# Patient Record
Sex: Female | Born: 1935 | Race: White | Hispanic: No | Smoking: Former smoker
Health system: Southern US, Community
[De-identification: ages and names within clinical notes are randomized; demographics above are authoritative.]

## PROBLEM LIST (undated history)

## (undated) DIAGNOSIS — I10 Essential (primary) hypertension: Secondary | ICD-10-CM

## (undated) DIAGNOSIS — E119 Type 2 diabetes mellitus without complications: Secondary | ICD-10-CM

## (undated) DIAGNOSIS — E785 Hyperlipidemia, unspecified: Secondary | ICD-10-CM

## (undated) DIAGNOSIS — L409 Psoriasis, unspecified: Secondary | ICD-10-CM

## (undated) HISTORY — PX: APPENDECTOMY: SHX54

## (undated) HISTORY — PX: ABDOMINAL HYSTERECTOMY: SHX81

## (undated) HISTORY — DX: Psoriasis, unspecified: L40.9

## (undated) HISTORY — PX: BLADDER REPAIR: SHX76

## (undated) HISTORY — DX: Essential (primary) hypertension: I10

## (undated) HISTORY — DX: Type 2 diabetes mellitus without complications: E11.9

## (undated) HISTORY — PX: CHOLECYSTECTOMY: SHX55

## (undated) HISTORY — PX: CAROTID STENT INSERTION: SHX5766

## (undated) HISTORY — DX: Hyperlipidemia, unspecified: E78.5

---

## 2005-07-07 ENCOUNTER — Ambulatory Visit: Admission: RE | Admit: 2005-07-07 | Discharge: 2005-07-07 | Payer: Self-pay | Admitting: *Deleted

## 2005-08-15 ENCOUNTER — Ambulatory Visit: Payer: Self-pay | Admitting: Cardiology

## 2005-08-22 ENCOUNTER — Ambulatory Visit: Payer: Self-pay | Admitting: Cardiology

## 2005-09-05 ENCOUNTER — Ambulatory Visit: Payer: Self-pay | Admitting: Cardiology

## 2005-10-09 ENCOUNTER — Inpatient Hospital Stay (HOSPITAL_COMMUNITY): Admission: RE | Admit: 2005-10-09 | Discharge: 2005-10-10 | Payer: Self-pay | Admitting: *Deleted

## 2005-10-09 ENCOUNTER — Encounter (INDEPENDENT_AMBULATORY_CARE_PROVIDER_SITE_OTHER): Payer: Self-pay | Admitting: *Deleted

## 2006-04-26 ENCOUNTER — Ambulatory Visit: Payer: Self-pay | Admitting: *Deleted

## 2006-07-19 ENCOUNTER — Ambulatory Visit: Payer: Self-pay | Admitting: *Deleted

## 2008-12-31 ENCOUNTER — Ambulatory Visit (HOSPITAL_COMMUNITY): Admission: RE | Admit: 2008-12-31 | Discharge: 2008-12-31 | Payer: Self-pay | Admitting: Ophthalmology

## 2009-01-21 ENCOUNTER — Ambulatory Visit (HOSPITAL_COMMUNITY): Admission: RE | Admit: 2009-01-21 | Discharge: 2009-01-21 | Payer: Self-pay | Admitting: Ophthalmology

## 2010-04-25 LAB — GLUCOSE, CAPILLARY: Glucose-Capillary: 112 mg/dL — ABNORMAL HIGH (ref 70–99)

## 2010-04-26 LAB — HEMOGLOBIN AND HEMATOCRIT, BLOOD: HCT: 39.8 % (ref 36.0–46.0)

## 2010-04-26 LAB — BASIC METABOLIC PANEL
CO2: 29 mEq/L (ref 19–32)
Chloride: 101 mEq/L (ref 96–112)
Creatinine, Ser: 0.78 mg/dL (ref 0.4–1.2)
Glucose, Bld: 138 mg/dL — ABNORMAL HIGH (ref 70–99)
Potassium: 3.8 mEq/L (ref 3.5–5.1)

## 2010-04-26 LAB — GLUCOSE, CAPILLARY: Glucose-Capillary: 122 mg/dL — ABNORMAL HIGH (ref 70–99)

## 2010-06-07 NOTE — Assessment & Plan Note (Signed)
OFFICE VISIT   Emily Sanders, Emily Sanders  DOB:  [birth year]                                       07/19/2006  CHART#:[id number]   OFFICE NOTE   The patient returns to the office today with a CT angiogram of the  abdomen with bilateral runoff.  This reveals mild bilateral superficial  femoral artery proximal stenoses.  Moderate stenosis of the left  popliteal artery.  Both anterior tibial arteries are noted to be  occluded.  Intact posterior tibial and peroneal runoff.   The patient has mild claudication symptoms.  No rest pain or night pain.   BP 99/59, pulse 71 per minute.  LOWER EXTREMITIES:  Well-perfused.  Intact popliteal, posterior tibial,  and dorsalis pedis pulses at 1+ bilaterally.   The patient has minimal symptoms and mild peripheral vascular disease.  I have informed her that I do not think she requires any interventions  at this time.  I will plan to follow up with her in 6 months with a  lower extremity Doppler evaluation.   Balinda Quails, M.D.  Electronically Signed   PGH/MEDQ  D:  07/19/2006  T:  07/20/2006  Job:  [id number]   cc:   [place]

## 2010-06-10 NOTE — Assessment & Plan Note (Signed)
[facility]                            [facility] OFFICE NOTE   Emily, Sanders                     MRN:          [id number]  DATE:09/05/2005                            DOB:          Jun 21, 1930    REASON FOR VISIT:  Scheduled followup from recent initial cardiology  evaluation.  Please refer to my office note of July 24 for full details.   At that time, the patient was referred for preoperative cardiac clearance  with no prior cardiac history.  She was found to have asymptomatic, high-  grade left ICA stenosis with residual moderate disease on the right and is  awaiting clearance to proceed with elective carotid endarterectomy.  The  patient has several cardiac risk factors, but no antecedent history of  exertional angina pectoris.  She was referred for several studies.  A 2-D  echocardiogram revealed normal left ventricular function (EF 60-65%).  Adenosine stress Cardiolite revealed question of partial reversibility in  the mid/slash apical inferior wall with calculated ejection fraction of  64'%.  This was reviewed by Dr. Diona Browner who felt that this represented a  low risk study.  The patient also had lower extremity vertebral Dopplers for  evaluation of left leg claudication.  This revealed normal bilateral ABIs  with decreased left toe brachial index (0.5) suggestive of distal disease.   These study results were reviewed in full with the patient.  She continues  to report no exertional symptoms of chest discomfort or dyspnea.   MEDICATIONS:  1. Hyzaar 100/25 mg daily.  2. Aspirin 325 mg daily.  3. Niacin 500 mg daily.   PHYSICAL EXAMINATION:  VITAL SIGNS:  Blood pressure 110/60, pulse 68 and  regular, weight 163.  GENERAL:  Physical examination unchanged from previous visit.   IMPRESSION:  Emily Sanders is a 77 year old female with no prior cardiac  history who is awaiting preoperative clearance to proceed with elective left  carotid endarterectomy, for treatment of an asymptomatic, high-grade, left  (ICA) stenosis.   The patient presents with no past or current history of exertional chest  discomfort or dyspnea on exertion.  She has undergone extensive cardiac work-  up consisting of a 2-D echocardiogram and an adenosine stress Cardiolite,  interpreted as a low risk study.   PLAN:  At this point, no further cardiac work-up is recommended.  The  results of the recent stress test were reviewed in full with Dr. Andee Lineman who  recommended that the patient is allowed to proceed with surgery as planned.  She is considered at a low risk for perioperative cardiac event and will  need to resume aspirin once cleared from a surgical standpoint.   I also instructed the patient to continue aggressive monitoring for risk  factors, notably hypertension and hyperlipidemia.  I also encouraged her to  continue cutting back on tobacco as she has been doing, and to hopefully  discontinue smoking altogether in the hear future.  We will plan on having  her return to the [facility] clinic on an as-needed basis.  Gene Serpe, PA-C                                Learta Codding, MD, [facility]   GS/MedQ  DD:  09/05/2005  DT:  09/05/2005  Job #:  [id number]   cc:   Balinda Quails, MD

## 2010-06-10 NOTE — Assessment & Plan Note (Signed)
[facility]                            [facility] OFFICE NOTE   SANJA, ELIZARDO                     MRN:          [id number]  DATE:08/15/2005                            DOB:          18-Oct-1930    REFERRING PHYSICIAN:  Balinda Quails, M.D., of Cardiovascular and Thoracic  Surgeons in [place].   PRIMARY CARDIOLOGIST:  Jonelle Sidle, M.D., (new).   REASON FOR CONSULTATION:  Ms. Trevino is a very pleasant 77 year old female  with no prior cardiac history, now referred for a preoperative cardiac  clearance.  She is awaiting scheduling for left carotid endarterectomy for  treatment of recent finding of high-grade left ICA stenosis during a routine  screening.   The patient presents with no symptoms suggestive of a TIA, and underwent a  recent carotid Doppler screening, apparently organized by a traveling  service.  A formal followup study, ordered by Dr. Virgina Organ, suggested severe  left ICA stenosis (80 to 99%), as well as moderate disease of the right ICA  (60 to 79%).  There is also suggestion of turbulent left vertebral artery  flow.   From a cardiac perspective, Ms. Daddona presents with no remote or recent  history of exertional chest discomfort.  She also denies any recent decrease  in her exercise tolerance level.   The patient's cardiac risk factors are notable for hypertension,  hyperlipidemia, glucose intolerance, long-standing tobacco smoking, age, as  well as family history.   Electrocardiogram today reveals normal sinus rhythm at 62 BPM with  bifascicular block with RBVD/LAHB, with left axis deviation;  there are no  acute changes.   ALLERGIES:  No known drug allergies.   CURRENT MEDICATIONS:  1.  Hyzaar 100/25 mg daily.  2.  Coated aspirin 325 mg daily.  3.  Niacin 500 mg daily.   PAST MEDICAL HISTORY:  1.  Hypertension.  2.  Hyperlipidemia.  3.  Borderline diabetes, treated with diet.  4.  Long-standing  tobacco.  5.  Status post appendectomy.  6.  Status post cholecystectomy.  7.  Status post hysterectomy.   SOCIAL HISTORY:  The patient is currently widowed (for the second time).  She continues to work full-time for a [employer] fund[employer].  She has six children who  all live up in the [place], [place] area.  She continues to smoke on average  one to two packs a week and has been doing so since her early 24s.  She  denies alcohol use.   FAMILY HISTORY:  Mother deceased at age 58, with long-standing history of  diabetes.  Father deceased at age 74, secondary to myocardial infarction  with prior history of MI.  The patient has one sister, age 45, with a  history of cerebral aneurysm treated surgically.  Of note, however, her  sister's daughter (the patient's niece), died in her early 33s, apparently  from a ruptured cerebral aneurysm.  The patient also reports a maternal  uncle, dying at age 75, also from a probable ruptured cerebral aneurysm.  The patient, however, denies any family history of stroke.   REVIEW OF SYSTEMS:  As noted in HPI.  The patient denies any remote or  recent development of exertional anginal pectoris.  She denies any  exertional dyspnea, orthopnea, or paroxysmal nocturnal dyspnea.  She does,  however, note some recent left lower extremity claudication of the left  thigh and buttock which she states has resolved since starting full dose  aspirin.  She denies any prior history of myocardial infarction, congestive  heart failure, syncope, or stroke.  Denies tachy-palpitations or symptoms  suggestive of reflux disease.  Remaining systems are negative.   PHYSICAL EXAMINATION:  VITAL SIGNS:  Blood pressure 100/70, pulse 60 and  regular, weight 165.  GENERAL:  A 77 year old female, sitting upright, in no apparent distress.  HEENT:  Normocephalic, atraumatic.  NECK:  Palpable carotid pulses with bilateral carotid bruits (left greater  than right).  LUNGS:  Clear to auscultation in  all fields.  HEART:  Regular rate and rhythm (S1 and S2).  A soft grade 1 to 2/6  holosystolic murmur in the upper LSB.  ABDOMEN:  Soft, nontender, intact bowel sounds, no bruits.  EXTREMITIES:  Palpable femoral pulses with a left femoral bruit;  2/4 right  dorsalis and posterior tibialis pulse;  nonpalpable left dorsalis/posterior  tibialis pulse with trace bilateral pedal edema.  NEUROLOGIC:  No focal deficits.   IMPRESSION:  1.  Abnormal resting electrocardiogram.      1.  Bifascicular block.  2.  Severe cerebrovascular disease.      1.  High-grade left internal carotid artery stenosis (80 to 99%).      2.  Residual 60 to 79% right internal carotid artery stenosis.  3.  Multiple cardiac risk factors.      1.  Hypertension.      2.  Hyperlipidemia.      3.  Glucose intolerance.      4.  Long-standing tobacco smoking.      5.  Family history of coronary artery disease.      6.  Age.  4.  Systolic murmur.  5.  Probable peripheral vascular disease.      1.  Left lower extremity claudication/left femoral bruit.   PLAN:  1.  Schedule Adenosine stress Cardiolite for risk stratification and      exclusion of underlying ischemia.  2.  Schedule 2-D echocardiogram for assessment of left ventricular function      and exclusion of significant valvular heart disease.  3.  Schedule lower extremity arterial Doppler's (ankle brachial indexes) for      assessment and exclusion of severe peripheral vascular disease.  4.  Schedule return clinic followup in approximately two weeks for review of      test results and further recommendations.                                   Gene Serpe, PA-C   GS/MedQ  DD:  08/15/2005  DT:  08/15/2005  Job #:  [id number]

## 2010-06-10 NOTE — Op Note (Signed)
NAME:  Emily Sanders, Emily Sanders NO.:  [id number]   MEDICAL RECORD NO.:  [id number]          PATIENT TYPE:  INP   LOCATION:  3313                         FACILITY:  [facility]   PHYSICIAN:  Balinda Quails, M.D.    DATE OF BIRTH:  1930/05/12   DATE OF PROCEDURE:  10/09/2005  DATE OF DISCHARGE:  10/10/2005                                 OPERATIVE REPORT   SURGEON:  Balinda Quails, MD.   ASSISTANT:  J.DHart Rochester, MD.   ANESTHETIC:  General endotracheal.   ANESTHESIOLOGIST:  Guadalupe Maple, M.D.   PREOPERATIVE DIAGNOSIS:  Severe left internal carotid artery stenosis.   POSTOPERATIVE DIAGNOSIS:  Severe left internal carotid artery stenosis.   PROCEDURE:  Left carotid endarterectomy Dacron patch angioplasty.   CLINICAL NOTE:  Emily Sanders is a 77 year old female with known severe  left internal carotid artery stenosis.  She was seen in the office and  evaluated for this.  She is scheduled at this time for left carotid  endarterectomy for reduction of stroke risk.  She has consented to surgery.  The risks and benefits of the operative procedure were explained to the  patient in detail with __________  morbidity mortality of 1-2%.   OPERATIVE PROCEDURE:  The patient was brought to the operating room in  stable hemodynamic condition.  Placed in supine position.  General  endotracheal anesthesia induced.  Foley catheter arterial line in place.  Left neck prepped and draped in a sterile fashion.   A curvilinear skin incision made along the anterior border of the left  sternomastoid muscle.  Dissection carried through the subcutaneous tissue  and platysma with electrocautery.  Deep dissection carried along the  anterior border of the sternomastoid to the carotid sheath.  The common  carotid artery mobilized down to the omohyoid muscle an d encircled with a  Vesseloop.  The superior thyroid and external carotid were encircled with  Vesseloops.  The internal carotid artery  followed distally up to the  posterior belly of the digastric muscle and encircled with a Vesseloop.  The  vagus nerve reflected posteriorly and preserved.  The hypoglossal nerve  clearly identified.   The carotid bifurcation revealed plaque disease extending into the origin of  the left internal carotid artery.  The patient administered 7000 units of  heparin intravenously.  Adequate circulation time permitted.  The carotid  vessels was controlled with clamps.  A  longitudinal arteriotomy made in the  distal common carotid artery.  The arteriotomy extended across the carotid  bulb and up into the internal carotid artery.   There was a high-grade stenosis of the left internal carotid artery at its  origin. A shunt was inserted.   The plaque removed with an endarterectomy elevator.  The endarterectomy  carried down into the common carotid artery with plaque and divided  transversely with Potts scissors.  The plaque then raised up into the vulva,  the superior thyroid and external carotid were endarterectomized using an  eversion technique.  The distal internal carotid artery plaque then  feathered out well.   Fragments of plaque removed with fine  forceps.  The site irrigated with  heparin saline solution.   A patch angioplasty endarterectomy site was then carried out with a running  6-0 Prolene suture using a Finesse Dacron patch.  At completion of the patch  angioplasty, the shunt was removed.  All vessels well flushed.  Clamps  removed directing initial antegrade flow up the external carotid artery,  following this the internal carotid was released.  There was an excellent  pulse and Doppler signal in the distal internal carotid artery.  The patient  administered 50 mg of protamine intravenously.  Adequate hemostasis  obtained.  Sponge and instrument counts correct.   The sternomastoid fascia then closed with running 2-0 Vicryl suture.  Platysma closed with running 3-0 Vicryl  suture.  The skin closed with 4-0  Monocryl.  Steri-Strips applied.  The patient tolerated the procedure well.  No apparent complications.  Transferred to recovery room in stable  condition.      Balinda Quails, M.D.  Electronically Signed     PGH/MEDQ  D:  10/09/2005  T:  10/10/2005  Job:  [id number]   cc:   Jenny Reichmann, MD

## 2010-06-10 NOTE — H&P (Signed)
NAME:  Emily Sanders, Emily Sanders            ACCOUNT NO.:  [id number]   MEDICAL RECORD NO.:  [id number]          PATIENT TYPE:  INP   LOCATION:  NA                           FACILITY:  [facility]   PHYSICIAN:  Balinda Quails, M.D.    DATE OF BIRTH:  February 29, 1930   DATE OF ADMISSION:  DATE OF DISCHARGE:                                HISTORY & PHYSICAL   DATE OF ADMISSION:  October 09, 2005   PRIMARY PHYSICIAN:  Dr. Colon Branch   CHIEF COMPLAINT:  Left internal carotid artery stenosis.   HISTORY OF PRESENT ILLNESS:  Ms. Emily Sanders is a 77 year old Caucasian female  referred to Dr. Madilyn Fireman for extracranial cardiovascular occlusive disease with  bilateral internal carotid artery stenosis.  She recently underwent  noninvasive testing by [facility] which revealed evidence  of peripheral vascular disease as well as carotid artery disease.  She saw  her primary physician who ordered a carotid duplex which was done at  [facility] confirming bilateral internal carotid artery  stenosis, more severe on the left.  Left internal carotid artery velocities  were 271/101 cm consistent with 80-99% stenosis.  The right showed 60-79%  internal carotid artery stenosis.  Left vertebral artery revealed turbulent  flow.  She was asymptomatic of carotid artery disease.  Of note she also had  CT angiography of the neck, also at [facility] confirming carotid  artery stenosis bilaterally.  Due to the degree of the severity of the left,  Dr. Madilyn Fireman recommended that she undergo left carotid endarterectomy to reduce  her risk for future stroke.  After discussing risks and benefits she agreed  to proceed and surgery was initially scheduled for June 19.  However, this  had to be rescheduled due to further workup for cardiac clearance.  She was  seen by Dr. Ival Bible physician assistant, Rozell Searing, at [facility].  EKG showed bifascicular block and further testing was ordered  including adenosine Cardiolite and 2-D echocardiogram.  These revealed an  ejection fraction of 60-65% and the Cardiolite revealed partial  reversibility in the mid/apical inferior wall.  This was reviewed by Dr.  Diona Browner who ultimately felt this was a low-risk study and cleared her from  a cardiac standpoint.  Today, September 13, she is at the [facility] office for a  preoperative history and physical.  She continues to be asymptomatic of her  carotid artery disease; specifically, denies history of stroke or TIA  symptoms, muscle weakness, dysarthria, dysphagia, or visual changes.   PAST MEDICAL HISTORY:  1. Extracranial cerebrovascular occlusive disease with bilateral internal      carotid artery stenosis, left greater than the right.  2. Psoriasis.  3. Hypertension.  4. Hyperlipidemia.  5. Glucose intolerance.  6. Arthritis.  7. Obesity.  8. Tobacco abuse, recently trying to quit.  9. Osteopenia.  10.Probable peripheral vascular disease of the left leg with reported ABI      of 0.5, although the report is not currently available.   PAST SURGICAL HISTORY:  Cholecystectomy, hysterectomy, and appendectomy -  all greater than 35 years ago.   ALLERGIES:  She has no known drug allergies.   MEDICATIONS:  1. Hyzaar 100/25 mg p.o. daily.  2. Aspirin 325 mg p.o. daily.  3. Niacin 250 mg t.i.d. with food.   REVIEW OF SYSTEMS:  See HPI for pertinent positives and negatives.  She  denies shortness of breath, chest pain, or hematochezia.  She does report  that her left leg tires easily and has pain consistent with claudication  involving the left hip, thigh and calf.  She also occasionally has cramps at  rest.  She has no lower extremity edema but says that her calves feel full  at times, particularly on the left.   SOCIAL HISTORY:  She is widowed with six children.  She lives alone.  Her  children live in [place] but her son who is a truck driver will be staying  with her for a few days  postoperatively.  She smoked cigarettes on and off  for several years starting after the age of 27.  She said she smoked up to a-  half to three-quarters pack per day at times and recently has decided to  quit on October 03, 2005.  She does not use alcohol.  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   FAMILY HISTORY:  Mother is deceased at 5 from complications of diabetes.  Her father deceased at 48 from CAD and myocardial infarction.  She has a  brother who has had a history of stroke and sister with diabetes mellitus.   PHYSICAL EXAMINATION:  VITAL SIGNS:  Blood pressure 118/68, heart rate 58,  respirations 20.  GENERAL APPEARANCE:  This is a 77 year old Caucasian female who is alert and  cooperative in no acute distress.  HEENT:  Head is normocephalic, atraumatic.  Pupils equal, round, and  reactive to light and accommodation.  Sclerae are nonicteric.  Her  extraocular movements are intact.  Her oral mucosa is pink and moist, no  lesions were noted.  She does wear complete upper and lower dentures.  NECK:  Her neck is supple, no obvious goiter was noted.  She has palpable  carotid pulses with soft bruits bilaterally.  RESPIRATORY:  Lung sounds are clear, unlabored, and symmetrical on  inspiration.  CARDIAC:  Her heart has a regular rate and rhythm although slightly  bradycardic at 58.  She has a 1/6 systolic ejection murmur auscultated at  the left sternal border.  ABDOMEN:  Abdomen is soft, nontender, nondistended with normoactive bowel  sounds and no hepatosplenomegaly was noted.  She does have a right upper  quadrant scar.  GENITOURINARY/RECTAL:  These exams were deferred.  EXTREMITIES:  Extremities are warm and dry without edema.  She has 2+ radial  pulses bilaterally, 1+ femoral pulse bilaterally, and 1-2+ distal pulses of  her dorsalis pedis and posterior tibial pulses.  Her toenails show evidence  of onychomycosis. NEUROLOGIC:  Neurologic exam is grossly  intact.  She is alert and oriented  x4.  Speech is clear, gait is steady.  Muscle strength is 5/5 in upper and  lower extremities.   ASSESSMENT:  Bilateral internal carotid artery stenosis left greater than  the right.   PLAN:  She will be electively admitted to [facility] to undergo a  left carotid endarterectomy by Dr. Denman George on October 09, 2005.  Postoperatively, Dr. Madilyn Fireman can discuss further monitoring of her right  coronary artery stenosis as well as further evaluation of her peripheral  vascular disease involving the left  leg.  We also discussed continued  smoking cessation at this visit.      Jerold Coombe, P.A.      Balinda Quails, M.D.  Electronically Signed    AWZ/MEDQ  D:  10/05/2005  T:  10/05/2005  Job:  [id number]

## 2010-06-10 NOTE — Discharge Summary (Signed)
NAME:  Emily, Sanders            ACCOUNT NO.:  [id number]   MEDICAL RECORD NO.:  [id number]          PATIENT TYPE:  INP   LOCATION:  3313                         FACILITY:  [facility]   PHYSICIAN:  Balinda Quails, M.D.    DATE OF BIRTH:  [birth year]   DATE OF ADMISSION:  10/09/2005  DATE OF DISCHARGE:  10/10/2005                                 DISCHARGE SUMMARY   DATE OF BIRTH:  August 03, 1930   PHYSICIAN:  Balinda Quails, M.D.   DATE OF ADMISSION:  October 09, 2005   DATE OF DISCHARGE:  October 10, 2005   ADMISSION DIAGNOSIS:  Bilateral internal carotid artery stenosis, left  greater than right.   DISCHARGE/SECONDARY DIAGNOSES:  1. Bilateral internal carotid artery stenosis, left greater than right,      status post left carotid endarterectomy.  2. Psoriasis.  3. Hypertension.  4. Hyperlipidemia.  5. Glucose intolerance.  6. Arthritis.  7. Obesity, mild.  8. Tobacco abuse, recently quit.  9. Osteopenia.  10.Probable peripheral vascular disease of the left leg with reported      ankle brachial index of 0.5, although no report is currently available.  11.History of cholecystectomy, hysterectomy, and appendectomy.  12.NO KNOWN DRUG ALLERGIES.   PROCEDURES:  October 09, 2005, left carotid endarterectomy with Dacron  patch angioplasty by Dr. Balinda Quails.   BRIEF HISTORY:  Emily Sanders is a 77 year old Caucasian female referred to Dr.  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  She recently underwent noninvasive testing by [employer] which revealed carotid artery disease and reportedly  peripheral vascular disease as well.  She saw her primary physician, who  ordered a carotid duplex, which was done at [facility],  confirming bilateral internal carotid artery stenosis, more severe on the  left.  Left internal carotid artery velocities were 271/101, consistent with  80% to 99% stenosis.   There was 60% to 79% stenosis on the right.  Left  vertebral artery revealed turbulent flow.  CT angiography of the neck was  also performed, confirming carotid artery stenosis.  She was asymptomatic.  Based on the severity of her left internal carotid artery stenosis, Dr.  Madilyn Fireman did recommend that she undergo left carotid endarterectomy to reduce  her risk for future stroke.  After discussing risks and benefits, she agreed  to proceed.  However, prior to scheduling, she was sent for cardiac  clearance and saw Dr. Diona Browner, who noted a systolic murmur.  She  subsequently underwent a Cardiolite and 2D echocardiogram, which revealed  ejection fraction of 60% to 65% and partial reversibility in the mid/apical  inferior wall.  Ultimately this was felt to be a low risk study and she was  cleared for surgery.   HOSPITAL COURSE:  Emily Sanders was electively admitted to [facility]  on October 09, 2005, and was taken to the operating room for left carotid  endarterectomy.  There were no intraoperative complications and  postoperatively she was extubated neurologically intact.  After a short stay  in the recovery unit, she was  transferred to the stepdown unit 3300, where  she remained until discharge.  She remained hemodynamically stable postop,  but did require saline fluid bolus for mild hypotension.  By the morning of  postoperative day 1, her blood pressure was overall felt stable in the low  100s/30-40.  She was ambulating without difficulty.  Her heart rhythm was  stable, showing sinus brady/sinus rhythm at the rate of between 50 and high  60s.  She is afebrile and saturating 98% on room air.  She is voiding  following removal of her Foley catheter.  She was tolerating regular diet  without nausea or vomiting or dysphagia.  Postoperative labs were stable  showing a white blood count of 8.4, hemoglobin 11.5, hematocrit 32.8,  platelet count 193, sodium 139, potassium 3.9, BUN 11,  creatinine 0.9, and  blood glucose of 118.  Physical exam showed her heart had a regular rate and  rhythm with a 1-2/6 systolic ejection murmur.  Lung sounds were initially  coarse at the bases, but these cleared with deep breaths.  No wheezes were  auscultated.  Her abdominal exam was benign and neurologically she remained  intact.  Her tongue was midline and she was moving all extremities strong  and symmetrically.  Her incision was clean and dry without evidence of  hematoma.  Later that morning, Emily Sanders met criteria for discharge and was  discharged home on October 10, 2005, in stable condition.   DISCHARGE MEDICATIONS:  1. Tylox 1-2 tablets p.o. q.4 hours p.r.n. pain.  2. Hyzaar 100/25 mg p.o. q. day.  3. Aspirin 325 mg p.o. q. day.  4. Niacin 250 mg p.o. t.i.d. with food.   DISCHARGE INSTRUCTIONS:  She is instructed to avoid driving or heavy lifting  for the next 2-3 weeks.  She was encouraged to continue daily walking and  breathing exercises.  She may shower and clean her incision gently with soap  and water starting October 11, 2005.  She may resume her preoperative  diet.  She is to follow up with Dr. Florina Ou [facility] office on November 02, 2005,  at 1 p.m., and should call sooner if needed or if she develops fever greater  than 101 or redness or drainage from her incision sites.      Jerold Coombe, P.A.      Balinda Quails, M.D.  Electronically Signed    AWZ/MEDQ  D:  10/10/2005  T:  10/10/2005  Job:  [id number]   cc:   Balinda Quails, M.D.  Jenny Reichmann, MD

## 2013-01-09 ENCOUNTER — Ambulatory Visit (INDEPENDENT_AMBULATORY_CARE_PROVIDER_SITE_OTHER): Payer: Medicare Other | Admitting: Podiatry

## 2013-01-09 ENCOUNTER — Encounter: Payer: Self-pay | Admitting: Podiatry

## 2013-01-09 VITALS — BP 122/57 | HR 76 | Resp 18 | Ht 64.0 in | Wt 155.0 lb

## 2013-01-09 DIAGNOSIS — B351 Tinea unguium: Secondary | ICD-10-CM

## 2013-01-09 DIAGNOSIS — M79609 Pain in unspecified limb: Secondary | ICD-10-CM

## 2013-01-09 NOTE — Progress Notes (Signed)
Subjective:     Patient ID: Emily Sanders, female   DOB: Aug 24, 1930, 79 y.o.   MRN: [id number]  HPI patient presents with painful nailbeds that are thick 1-5 both feet and impossible for her to cut   Review of Systems     Objective:   Physical Exam Neurovascular status unchanged with thick painful nail bed 1-5 both feet    Assessment:     Mycotic nail infection with pain 1-5 both fee tot    Plan:     Debridement of painful nail bed 1-5 both feet

## 2013-01-09 NOTE — Progress Notes (Signed)
Pt presents to have B/L 1 - 5 toenails trimmed.

## 2013-04-03 ENCOUNTER — Ambulatory Visit: Payer: Medicare Other | Admitting: Podiatry

## 2013-04-17 ENCOUNTER — Ambulatory Visit: Payer: Medicare Other | Admitting: Podiatry

## 2013-04-17 ENCOUNTER — Encounter: Payer: Self-pay | Admitting: Podiatry

## 2013-04-17 ENCOUNTER — Ambulatory Visit (INDEPENDENT_AMBULATORY_CARE_PROVIDER_SITE_OTHER): Payer: Medicare Other | Admitting: Podiatry

## 2013-04-17 DIAGNOSIS — B351 Tinea unguium: Secondary | ICD-10-CM

## 2013-04-17 DIAGNOSIS — M79609 Pain in unspecified limb: Secondary | ICD-10-CM

## 2013-04-17 NOTE — Patient Instructions (Signed)
Diabetes and Foot Care Diabetes may cause you to have problems because of poor blood supply (circulation) to your feet and legs. This may cause the skin on your feet to become thinner, break easier, and heal more slowly. Your skin may become dry, and the skin may peel and crack. You may also have nerve damage in your legs and feet causing decreased feeling in them. You may not notice minor injuries to your feet that could lead to infections or more serious problems. Taking care of your feet is one of the most important things you can do for yourself.  HOME CARE INSTRUCTIONS  Wear shoes at all times, even in the house. Do not go barefoot. Bare feet are easily injured.  Check your feet daily for blisters, cuts, and redness. If you cannot see the bottom of your feet, use a mirror or ask someone for help.  Wash your feet with warm water (do not use hot water) and mild soap. Then pat your feet and the areas between your toes until they are completely dry. Do not soak your feet as this can dry your skin.  Apply a moisturizing lotion or petroleum jelly (that does not contain alcohol and is unscented) to the skin on your feet and to dry, brittle toenails. Do not apply lotion between your toes.  Trim your toenails straight across. Do not dig under them or around the cuticle. File the edges of your nails with an emery board or nail file.  Do not cut corns or calluses or try to remove them with medicine.  Wear clean socks or stockings every day. Make sure they are not too tight. Do not wear knee-high stockings since they may decrease blood flow to your legs.  Wear shoes that fit properly and have enough cushioning. To break in new shoes, wear them for just a few hours a day. This prevents you from injuring your feet. Always look in your shoes before you put them on to be sure there are no objects inside.  Do not cross your legs. This may decrease the blood flow to your feet.  If you find a minor scrape,  cut, or break in the skin on your feet, keep it and the skin around it clean and dry. These areas may be cleansed with mild soap and water. Do not cleanse the area with peroxide, alcohol, or iodine.  When you remove an adhesive bandage, be sure not to damage the skin around it.  If you have a wound, look at it several times a day to make sure it is healing.  Do not use heating pads or hot water bottles. They may burn your skin. If you have lost feeling in your feet or legs, you may not know it is happening until it is too late.  Make sure your health care provider performs a complete foot exam at least annually or more often if you have foot problems. Report any cuts, sores, or bruises to your health care provider immediately. SEEK MEDICAL CARE IF:   You have an injury that is not healing.  You have cuts or breaks in the skin.  You have an ingrown nail.  You notice redness on your legs or feet.  You feel burning or tingling in your legs or feet.  You have pain or cramps in your legs and feet.  Your legs or feet are numb.  Your feet always feel cold. SEEK IMMEDIATE MEDICAL CARE IF:   There is increasing redness,   swelling, or pain in or around a wound.  There is a red line that goes up your leg.  Pus is coming from a wound.  You develop a fever or as directed by your health care provider.  You notice a bad smell coming from an ulcer or wound. Document Released: 01/07/2000 Document Revised: 09/11/2012 Document Reviewed: 06/18/2012 ExitCare Patient Information 2014 [employer].  

## 2013-04-18 NOTE — Progress Notes (Signed)
Subjective:     Patient ID: Emily Sanders, female   DOB: 1930-12-31, 80 y.o.   MRN: [id number]  HPI patient presents with nail disease 1-5 both feet that are thick and she cannot take care of herself   Review of Systems     Objective:   Physical Exam Neurovascular status intact with thick brittle bed 1-5 both feet that her ingrown in the corners and tender    Assessment:     Chronic mycotic nail infection with pain 1-5 both feet    Plan:     Debridement painful nailbeds 1-5 both feet with no iatrogenic bleeding noted

## 2013-07-21 ENCOUNTER — Ambulatory Visit (INDEPENDENT_AMBULATORY_CARE_PROVIDER_SITE_OTHER): Payer: Medicare Other | Admitting: Podiatry

## 2013-07-21 ENCOUNTER — Encounter: Payer: Self-pay | Admitting: Podiatry

## 2013-07-21 VITALS — BP 103/51 | HR 74 | Resp 18

## 2013-07-21 DIAGNOSIS — M79609 Pain in unspecified limb: Secondary | ICD-10-CM

## 2013-07-21 DIAGNOSIS — M79673 Pain in unspecified foot: Secondary | ICD-10-CM

## 2013-07-21 DIAGNOSIS — B351 Tinea unguium: Secondary | ICD-10-CM

## 2013-07-22 NOTE — Progress Notes (Signed)
Subjective:     Patient ID: Emily Sanders, female   DOB: 03-03-1930, 80 y.o.   MRN: [id number]  HPI patient is found to have thick yellow nailbeds 1-5 both feet that are painful and she cannot cut   Review of Systems     Objective:   Physical Exam Neurovascular status unchanged with thick yellow nailbeds 1-5 both feet that are painful when pressed    Assessment:     Mycotic nail infection is with pain 1-5 both feet    Plan:     Debris painful nailbeds 1-5 both feet with no iatrogenic bleeding noted

## 2013-11-03 ENCOUNTER — Other Ambulatory Visit: Payer: TRICARE For Life (TFL)

## 2015-01-04 ENCOUNTER — Encounter: Payer: Self-pay | Admitting: Podiatry

## 2015-01-04 ENCOUNTER — Ambulatory Visit (INDEPENDENT_AMBULATORY_CARE_PROVIDER_SITE_OTHER): Payer: Medicare Other | Admitting: Podiatry

## 2015-01-04 DIAGNOSIS — L600 Ingrowing nail: Secondary | ICD-10-CM

## 2015-01-04 DIAGNOSIS — L84 Corns and callosities: Secondary | ICD-10-CM

## 2015-01-04 NOTE — Patient Instructions (Signed)

## 2015-01-07 NOTE — Progress Notes (Signed)
Subjective:     Patient ID: Emily Sanders, female   DOB: 29-Aug-1930, 81 y.o.   MRN: [id number]  HPI patient states I am ready to get this nail taken off and I have these chronic lesions on my feet they continue to give me trouble. States she cannot trim the nail it herself and it becomes painful and is becoming thicker   Review of Systems     Objective:   Physical Exam Neurovascular status intact muscle strength adequate with thick deformed big toenail left it's painful when pressed dorsally and is making it hard to wear shoe gear with comfortably. Keratotic lesions plantar aspect fifth metatarsal bilateral    Assessment:     Damaged hallux nail left with chronic deformity along with keratotic lesion formation    Plan:     Reviewed nail removal and discussed risk. Today I infiltrated the left hallux 60 mg like Marcaine mixture did sterile prep to the area and then remove the nail completely exposed the matrix and applied phenol 5 applications 30 seconds followed by alcohol lavage and sterile dressing. Gave instructions on soaks and reappoint and debrided lesions with no iatrogenic bleeding noted

## 2015-01-11 ENCOUNTER — Telehealth: Payer: Self-pay | Admitting: *Deleted

## 2015-01-11 NOTE — Telephone Encounter (Signed)
Called patient at [phone] (Home #) to check to see how they were doing from their ingrown toenail that was performed on Monday, January 04, 2015. I could not leave a message, because the phone just kept ringing and did not roll over to voice mail.

## 2015-02-08 DIAGNOSIS — I10 Essential (primary) hypertension: Secondary | ICD-10-CM | POA: Diagnosis not present

## 2015-02-08 DIAGNOSIS — E119 Type 2 diabetes mellitus without complications: Secondary | ICD-10-CM | POA: Diagnosis not present

## 2015-05-06 DIAGNOSIS — E559 Vitamin D deficiency, unspecified: Secondary | ICD-10-CM | POA: Diagnosis not present

## 2015-05-06 DIAGNOSIS — E7800 Pure hypercholesterolemia, unspecified: Secondary | ICD-10-CM | POA: Diagnosis not present

## 2015-05-06 DIAGNOSIS — E782 Mixed hyperlipidemia: Secondary | ICD-10-CM | POA: Diagnosis not present

## 2015-05-06 DIAGNOSIS — I10 Essential (primary) hypertension: Secondary | ICD-10-CM | POA: Diagnosis not present

## 2015-05-06 DIAGNOSIS — R5381 Other malaise: Secondary | ICD-10-CM | POA: Diagnosis not present

## 2015-05-06 DIAGNOSIS — E569 Vitamin deficiency, unspecified: Secondary | ICD-10-CM | POA: Diagnosis not present

## 2015-05-06 DIAGNOSIS — Z79899 Other long term (current) drug therapy: Secondary | ICD-10-CM | POA: Diagnosis not present

## 2015-05-06 DIAGNOSIS — R531 Weakness: Secondary | ICD-10-CM | POA: Diagnosis not present

## 2015-05-06 DIAGNOSIS — E119 Type 2 diabetes mellitus without complications: Secondary | ICD-10-CM | POA: Diagnosis not present

## 2015-06-02 ENCOUNTER — Encounter: Payer: Self-pay | Admitting: Podiatry

## 2015-06-02 ENCOUNTER — Ambulatory Visit (INDEPENDENT_AMBULATORY_CARE_PROVIDER_SITE_OTHER): Payer: Medicare Other | Admitting: Podiatry

## 2015-06-02 DIAGNOSIS — M79673 Pain in unspecified foot: Secondary | ICD-10-CM | POA: Diagnosis not present

## 2015-06-02 DIAGNOSIS — B351 Tinea unguium: Secondary | ICD-10-CM

## 2015-06-02 DIAGNOSIS — L600 Ingrowing nail: Secondary | ICD-10-CM

## 2015-06-02 NOTE — Patient Instructions (Signed)

## 2015-06-03 NOTE — Progress Notes (Signed)
Subjective:     Patient ID: Emily Sanders, female   DOB: 1930-02-26, 82 y.o.   MRN: [id number]  HPI patient presents with elongated nailbeds 1-5 both feet with yellow dystrophic painful nailbeds 1-5 of both feet   Review of Systems     Objective:   Physical Exam Neurovascular status intact with thick yellow brittle nailbeds 1-5 both feet that are painful when palpated    Assessment:     Mycotic nail infection with pain 1-5 both feet    Plan:     Debris painful nailbeds 1-5 both feet with no iatrogenic bleeding noted

## 2015-08-18 DIAGNOSIS — E119 Type 2 diabetes mellitus without complications: Secondary | ICD-10-CM | POA: Diagnosis not present

## 2015-08-18 DIAGNOSIS — I10 Essential (primary) hypertension: Secondary | ICD-10-CM | POA: Diagnosis not present

## 2015-09-16 DIAGNOSIS — R5381 Other malaise: Secondary | ICD-10-CM | POA: Diagnosis not present

## 2015-09-16 DIAGNOSIS — Z79899 Other long term (current) drug therapy: Secondary | ICD-10-CM | POA: Diagnosis not present

## 2015-09-16 DIAGNOSIS — I10 Essential (primary) hypertension: Secondary | ICD-10-CM | POA: Diagnosis not present

## 2015-09-16 DIAGNOSIS — E119 Type 2 diabetes mellitus without complications: Secondary | ICD-10-CM | POA: Diagnosis not present

## 2015-09-16 DIAGNOSIS — R531 Weakness: Secondary | ICD-10-CM | POA: Diagnosis not present

## 2015-12-14 DIAGNOSIS — I10 Essential (primary) hypertension: Secondary | ICD-10-CM | POA: Diagnosis not present

## 2015-12-14 DIAGNOSIS — E119 Type 2 diabetes mellitus without complications: Secondary | ICD-10-CM | POA: Diagnosis not present

## 2016-03-02 DIAGNOSIS — D225 Melanocytic nevi of trunk: Secondary | ICD-10-CM | POA: Diagnosis not present

## 2016-03-02 DIAGNOSIS — L400 Psoriasis vulgaris: Secondary | ICD-10-CM | POA: Diagnosis not present

## 2016-04-04 ENCOUNTER — Ambulatory Visit (INDEPENDENT_AMBULATORY_CARE_PROVIDER_SITE_OTHER): Payer: Medicare Other | Admitting: Pediatrics

## 2016-04-04 ENCOUNTER — Encounter (INDEPENDENT_AMBULATORY_CARE_PROVIDER_SITE_OTHER): Payer: Self-pay

## 2016-04-04 ENCOUNTER — Encounter: Payer: Self-pay | Admitting: Pediatrics

## 2016-04-04 VITALS — BP 128/65 | HR 69 | Temp 97.0°F | Ht 64.0 in | Wt 163.0 lb

## 2016-04-04 DIAGNOSIS — E559 Vitamin D deficiency, unspecified: Secondary | ICD-10-CM

## 2016-04-04 DIAGNOSIS — E785 Hyperlipidemia, unspecified: Secondary | ICD-10-CM | POA: Diagnosis not present

## 2016-04-04 DIAGNOSIS — E119 Type 2 diabetes mellitus without complications: Secondary | ICD-10-CM | POA: Diagnosis not present

## 2016-04-04 DIAGNOSIS — I10 Essential (primary) hypertension: Secondary | ICD-10-CM

## 2016-04-04 DIAGNOSIS — L409 Psoriasis, unspecified: Secondary | ICD-10-CM

## 2016-04-04 DIAGNOSIS — I152 Hypertension secondary to endocrine disorders: Secondary | ICD-10-CM | POA: Insufficient documentation

## 2016-04-04 DIAGNOSIS — E1159 Type 2 diabetes mellitus with other circulatory complications: Secondary | ICD-10-CM | POA: Insufficient documentation

## 2016-04-04 LAB — BAYER DCA HB A1C WAIVED: HB A1C (BAYER DCA - WAIVED): 7.1 % — ABNORMAL HIGH (ref <7.0)

## 2016-04-04 MED ORDER — ROSUVASTATIN CALCIUM 20 MG PO TABS: 20.0000 mg | ORAL_TABLET | Freq: Every day | ORAL | 1 refills | Status: DC

## 2016-04-04 MED ORDER — LOSARTAN POTASSIUM 50 MG PO TABS: 50.0000 mg | ORAL_TABLET | Freq: Every day | ORAL | 1 refills | Status: DC

## 2016-04-04 MED ORDER — METFORMIN HCL 500 MG PO TABS: 500.0000 mg | ORAL_TABLET | Freq: Two times a day (BID) | ORAL | 1 refills | Status: DC

## 2016-04-04 NOTE — Progress Notes (Signed)
Subjective:   Patient ID: Emily Sanders, female    DOB: Dec 31, 1930, 83 y.o.   MRN: [id number] CC: New Patient (Initial Visit) f/u multiple med problems HPI: Emily Sanders is a 83 y.o. female presenting for New Patient (Initial Visit)  DM2: BGLs at home 100s-120s  controlled with diet, metformin  HTN: no CP, no SOB BPs at home have been 120s/70s Taking med regularly  Had bone density last year she reports was normal  Recent abnormal b/l ABI through routine health screening with insurance/private company On ASA, statin Former smoker  Still works as a Quarry manager with elderly pts  H/o psoriasis, clobetasol helps when has a rash  HLD: on crestor, no s/e No h/o MI, CVA  Past Medical History:  Diagnosis Date  . Diabetes mellitus without complication ([place])   . Hyperlipidemia   . Hypertension   . Psoriasis    Family History  Problem Relation Age of Onset  . Diabetes Mother   . Heart disease Father    Social History   Social History  . Marital status: Widowed    Spouse name: N/A  . Number of children: N/A  . Years of education: N/A   Social History Main Topics  . Smoking status: Former Research scientist (life sciences)  . Smokeless tobacco: Never Used  . Alcohol use No  . Drug use: No  . Sexual activity: Not Asked   Other Topics Concern  . None   Social History Narrative  . None   ROS: All systems negative other than what is in HPI  Objective:    BP 128/65   Pulse 69   Temp 97 F (36.1 C) (Oral)   Ht '5\' 4"'  (1.626 m)   Wt 163 lb (73.9 kg)   BMI 27.98 kg/m   Wt Readings from Last 3 Encounters:  04/04/16 163 lb (73.9 kg)  01/09/13 155 lb (70.3 kg)    Gen: NAD, alert, cooperative with exam, NCAT EYES: EOMI, no conjunctival injection, or no icterus ENT:  OP without erythema LYMPH: no cervical LAD CV: NRRR, normal Y3/K1, systolic ejection murmur II/VI at the RUSB, distal pulses 2+ b/l Resp: moving air well, a few scattered wheezes with inspiration, not with every breath,  normal WOB Abd: +BS, soft, NTND. no guarding or organomegaly Ext: No edema, warm, see foot exam documentation Neuro: Alert and oriented, sensation intact b/l feet  Assessment & Plan:  Emily Sanders was seen today for new patient (initial visit).  Diagnoses and all orders for this visit:  Essential hypertension Well controlled Cont losartan Labs today -     losartan (COZAAR) 50 MG tablet; Take 1 tablet (50 mg total) by mouth daily. -     CMP14+EGFR  Vitamin D deficiency dexa recently normal per pt Cont vitamin D, check level -     VITAMIN D 25 Hydroxy (Vit-D Deficiency, Fractures)  Type 2 diabetes mellitus without complication, without long-term current use of insulin (HCC) a1c 7.1, on metformin alone, avoiding sugar Pt to schedule eye exam See foot exam documentation -     Bayer DCA Hb A1c Waived -     metFORMIN (GLUCOPHAGE) 500 MG tablet; Take 1 tablet (500 mg total) by mouth 2 (two) times daily with a meal. -     Microalbumin / creatinine urine ratio  Psoriasis Stable, Occasional flares, clobetasol helps  Hyperlipidemia, unspecified hyperlipidemia type Stable, cont crestor -     Lipid panel -     rosuvastatin (CRESTOR) 20 MG tablet; Take 1 tablet (20  mg total) by mouth at bedtime.  Follow up plan: Return in about 3 months (around 07/05/2016) for med follow up 3 mo. Assunta Found, MD [place]

## 2016-04-04 NOTE — Patient Instructions (Signed)
Call to schedule eye exam

## 2016-04-05 LAB — CMP14+EGFR
ALBUMIN: 4.4 g/dL (ref 3.5–4.7)
ALK PHOS: 78 IU/L (ref 39–117)
ALT: 20 IU/L (ref 0–32)
AST: 24 IU/L (ref 0–40)
Albumin/Globulin Ratio: 1.8 (ref 1.2–2.2)
BUN / CREAT RATIO: 24 (ref 12–28)
BUN: 20 mg/dL (ref 8–27)
Bilirubin Total: 0.7 mg/dL (ref 0.0–1.2)
CO2: 25 mmol/L (ref 18–29)
CREATININE: 0.84 mg/dL (ref 0.57–1.00)
Calcium: 9 mg/dL (ref 8.7–10.3)
Chloride: 99 mmol/L (ref 96–106)
GFR, EST AFRICAN AMERICAN: 74 mL/min/{1.73_m2} (ref >59)
GFR, EST NON AFRICAN AMERICAN: 64 mL/min/{1.73_m2} (ref >59)
GLOBULIN, TOTAL: 2.4 g/dL (ref 1.5–4.5)
GLUCOSE: 90 mg/dL (ref 65–99)
Potassium: 4.2 mmol/L (ref 3.5–5.2)
SODIUM: 140 mmol/L (ref 134–144)
TOTAL PROTEIN: 6.8 g/dL (ref 6.0–8.5)

## 2016-04-05 LAB — LIPID PANEL
CHOL/HDL RATIO: 4.2 ratio (ref 0.0–4.4)
Cholesterol, Total: 155 mg/dL (ref 100–199)
HDL: 37 mg/dL — AB (ref >39)
LDL CALC: 90 mg/dL (ref 0–99)
Triglycerides: 138 mg/dL (ref 0–149)
VLDL CHOLESTEROL CAL: 28 mg/dL (ref 5–40)

## 2016-04-05 LAB — MICROALBUMIN / CREATININE URINE RATIO
Creatinine, Urine: 111.6 mg/dL
MICROALB/CREAT RATIO: 5 mg/g{creat} (ref 0.0–30.0)
Microalbumin, Urine: 5.6 ug/mL

## 2016-04-05 LAB — VITAMIN D 25 HYDROXY (VIT D DEFICIENCY, FRACTURES): VIT D 25 HYDROXY: 24.4 ng/mL — AB (ref 30.0–100.0)

## 2016-04-19 ENCOUNTER — Other Ambulatory Visit: Payer: Self-pay | Admitting: Pediatrics

## 2016-04-19 DIAGNOSIS — E119 Type 2 diabetes mellitus without complications: Secondary | ICD-10-CM

## 2016-04-19 DIAGNOSIS — I10 Essential (primary) hypertension: Secondary | ICD-10-CM

## 2016-04-20 ENCOUNTER — Telehealth: Payer: Self-pay | Admitting: Pediatrics

## 2016-04-24 MED ORDER — LOSARTAN POTASSIUM 50 MG PO TABS: 50.0000 mg | ORAL_TABLET | Freq: Every day | ORAL | 1 refills | Status: DC

## 2016-04-24 MED ORDER — METFORMIN HCL 500 MG PO TABS: 500.0000 mg | ORAL_TABLET | Freq: Two times a day (BID) | ORAL | 1 refills | Status: DC

## 2016-04-24 NOTE — Telephone Encounter (Signed)
[facility] today, filled. NA and no VM, but done

## 2016-04-24 NOTE — Telephone Encounter (Signed)
duplicate

## 2016-07-05 ENCOUNTER — Encounter: Payer: Self-pay | Admitting: Pediatrics

## 2016-07-05 ENCOUNTER — Ambulatory Visit (INDEPENDENT_AMBULATORY_CARE_PROVIDER_SITE_OTHER): Payer: Medicare Other

## 2016-07-05 ENCOUNTER — Ambulatory Visit (INDEPENDENT_AMBULATORY_CARE_PROVIDER_SITE_OTHER): Payer: Medicare Other | Admitting: Pediatrics

## 2016-07-05 VITALS — BP 125/66 | HR 60 | Temp 96.7°F | Ht 64.0 in | Wt 163.0 lb

## 2016-07-05 DIAGNOSIS — L409 Psoriasis, unspecified: Secondary | ICD-10-CM | POA: Diagnosis not present

## 2016-07-05 DIAGNOSIS — R062 Wheezing: Secondary | ICD-10-CM | POA: Diagnosis not present

## 2016-07-05 DIAGNOSIS — I10 Essential (primary) hypertension: Secondary | ICD-10-CM | POA: Diagnosis not present

## 2016-07-05 DIAGNOSIS — E785 Hyperlipidemia, unspecified: Secondary | ICD-10-CM | POA: Diagnosis not present

## 2016-07-05 DIAGNOSIS — B351 Tinea unguium: Secondary | ICD-10-CM | POA: Diagnosis not present

## 2016-07-05 DIAGNOSIS — E119 Type 2 diabetes mellitus without complications: Secondary | ICD-10-CM

## 2016-07-05 LAB — BAYER DCA HB A1C WAIVED: HB A1C: 6.8 % (ref <7.0)

## 2016-07-05 MED ORDER — ROSUVASTATIN CALCIUM 20 MG PO TABS: 20.0000 mg | ORAL_TABLET | Freq: Every day | ORAL | 1 refills | Status: DC

## 2016-07-05 MED ORDER — LANCETS MISC: 11 refills | Status: DC

## 2016-07-05 MED ORDER — GLUCOSE BLOOD VI STRP: ORAL_STRIP | 12 refills | Status: DC

## 2016-07-05 NOTE — Progress Notes (Signed)
  Subjective:   Patient ID: Emily Sanders, female    DOB: 03-05-1930, 83 y.o.   MRN: [id number] CC: Follow-up (3 month) DM2 HPI: Emily Sanders is a 83 y.o. female presenting for Follow-up (3 month)  DM2: BGLs 110s in the morning Bothered by painful toe nails b/l, has fungal nails, seen by podiatry in the past but has been a while Sometimes has sore areas from the toenails, too thick to trim at home  HTN:  No CP, no SOB  HLD: taking rosuvastatin nightly, no s/e   No SOB, normal appetite No constipation No h/o smoking, no wheezing or breathing problems at home     Psoriasis: much improved with clobetasol, small area remaining on L elbow  Relevant past medical, surgical, family and social history reviewed. Allergies and medications reviewed and updated. History  Smoking Status  . Former Smoker  Smokeless Tobacco  . Never Used   ROS: Per HPI   Objective:    BP 125/66   Pulse 60   Temp (!) 96.7 F (35.9 C) (Oral)   Ht 5\' 4"  (1.626 m)   Wt 163 lb (73.9 kg)   BMI 27.98 kg/m   Wt Readings from Last 3 Encounters:  07/05/16 163 lb (73.9 kg)  04/04/16 163 lb (73.9 kg)  01/09/13 155 lb (70.3 kg)    Gen: NAD, alert, cooperative with exam, NCAT EYES: EOMI, no conjunctival injection, or no icterus CV: NRRR, normal S1/S2, no murmur, distal pulses 2+ b/l Resp: R sided whistling/wheezing with inspiration and expiration, normal breath sounds L side. normal WOB Ext: No edema, warm Neuro: Alert and oriented, strength equal b/l UE and LE, coordination grossly normal MSK: normal muscle bulk Skin: thick toenails throughout, minimal toenail present great toe b/l Second toe L foot with medial callus  Assessment & Plan:  Sahmya was seen today for follow-up multiple med problems  Diagnoses and all orders for this visit:  Type 2 diabetes mellitus without complication, without long-term current use of insulin (HCC) A1c 6.8 cpont current meds, avoid high sugar/carb foods -      Bayer DCA Hb A1c Waived -     Ambulatory referral to Podiatry -     glucose blood (TRUE METRIX BLOOD GLUCOSE TEST) test strip; Use as instructed  Hyperlipidemia, unspecified hyperlipidemia type Stable, cont current med -     rosuvastatin (CRESTOR) 20 MG tablet; Take 1 tablet (20 mg total) by mouth at bedtime.  Onychomycosis -     Ambulatory referral to Podiatry  Psoriasis Stable, cont clobetasol as needed  Essential hypertension Well controlled, cont losartan  Wheezing Asymptomatic, with abnormal exam Will get cxr -     DG Chest 2 View; Future  Follow up plan: Return in about 3 months (around 10/05/2016). Assunta Found, MD [place]

## 2016-07-05 NOTE — Patient Instructions (Signed)
A1c 6.8 today--come back in 3 months

## 2016-07-06 ENCOUNTER — Telehealth: Payer: Self-pay | Admitting: Pediatrics

## 2016-07-06 DIAGNOSIS — E785 Hyperlipidemia, unspecified: Secondary | ICD-10-CM

## 2016-07-06 MED ORDER — ROSUVASTATIN CALCIUM 20 MG PO TABS: 20.0000 mg | ORAL_TABLET | Freq: Every day | ORAL | 1 refills | Status: DC

## 2016-07-06 NOTE — Telephone Encounter (Signed)
[facility] received refills for pts rosuvastatin, lancets & test strips Test Strips for True Metrix need a PA (case ID [id number] with [facility]) they cover Free Style light, & Precision  Left mssg on pts VM to call back

## 2016-07-27 ENCOUNTER — Telehealth: Payer: Self-pay | Admitting: Pediatrics

## 2016-07-27 NOTE — Telephone Encounter (Signed)
Pt will schedule apt

## 2016-10-12 ENCOUNTER — Encounter: Payer: Self-pay | Admitting: Podiatry

## 2016-10-12 ENCOUNTER — Ambulatory Visit (INDEPENDENT_AMBULATORY_CARE_PROVIDER_SITE_OTHER): Payer: Medicare Other | Admitting: Podiatry

## 2016-10-12 DIAGNOSIS — M79674 Pain in right toe(s): Secondary | ICD-10-CM | POA: Diagnosis not present

## 2016-10-12 DIAGNOSIS — M79675 Pain in left toe(s): Secondary | ICD-10-CM

## 2016-10-12 DIAGNOSIS — B351 Tinea unguium: Secondary | ICD-10-CM

## 2016-10-12 DIAGNOSIS — L84 Corns and callosities: Secondary | ICD-10-CM

## 2016-10-12 NOTE — Progress Notes (Signed)
Subjective:    Patient ID: Emily Sanders, female   DOB: 83 y.o.   MRN: [id number]   HPI patient presents with severe nail disease 1-5 both feet and a very painful lesion second digit left with thickness of the nailbeds noted and yellow chronic discoloration of the beds    ROS      Objective:  Physical Exam neurovascular status intact yellow discomfort with pain noted with thick yellow brittle nailbeds 1-5 both feet   Assessment:   Mycotic nail infections with pain 1-5 both feet      Plan:   Debris painful nailbeds 1-5 both feet with no iatrogenic bleeding noted

## 2016-10-20 ENCOUNTER — Encounter: Payer: Self-pay | Admitting: Pediatrics

## 2016-10-20 ENCOUNTER — Ambulatory Visit (INDEPENDENT_AMBULATORY_CARE_PROVIDER_SITE_OTHER): Payer: Medicare Other | Admitting: Pediatrics

## 2016-10-20 VITALS — BP 136/70 | HR 75 | Temp 97.7°F | Ht 64.0 in | Wt 158.6 lb

## 2016-10-20 DIAGNOSIS — I6522 Occlusion and stenosis of left carotid artery: Secondary | ICD-10-CM | POA: Diagnosis not present

## 2016-10-20 DIAGNOSIS — Z23 Encounter for immunization: Secondary | ICD-10-CM | POA: Diagnosis not present

## 2016-10-20 DIAGNOSIS — E119 Type 2 diabetes mellitus without complications: Secondary | ICD-10-CM

## 2016-10-20 DIAGNOSIS — E785 Hyperlipidemia, unspecified: Secondary | ICD-10-CM | POA: Diagnosis not present

## 2016-10-20 DIAGNOSIS — L409 Psoriasis, unspecified: Secondary | ICD-10-CM

## 2016-10-20 DIAGNOSIS — I10 Essential (primary) hypertension: Secondary | ICD-10-CM

## 2016-10-20 MED ORDER — ROSUVASTATIN CALCIUM 20 MG PO TABS: 20.0000 mg | ORAL_TABLET | Freq: Every day | ORAL | 1 refills | Status: DC

## 2016-10-20 MED ORDER — METFORMIN HCL 500 MG PO TABS: 500.0000 mg | ORAL_TABLET | Freq: Two times a day (BID) | ORAL | 1 refills | Status: DC

## 2016-10-20 MED ORDER — CLOBETASOL PROPIONATE 0.05 % EX SOLN: 1.0000 "application " | Freq: Two times a day (BID) | CUTANEOUS | 0 refills | Status: DC

## 2016-10-20 MED ORDER — LANCETS MISC: 11 refills | Status: DC

## 2016-10-20 MED ORDER — GLUCOSE BLOOD VI STRP: ORAL_STRIP | 12 refills | Status: DC

## 2016-10-20 MED ORDER — LOSARTAN POTASSIUM 50 MG PO TABS: 50.0000 mg | ORAL_TABLET | Freq: Every day | ORAL | 1 refills | Status: DC

## 2016-10-20 NOTE — Progress Notes (Signed)
  Subjective:   Patient ID: Emily Sanders, female    DOB: [birth year], 83 y.o.   MRN: [id number] CC: Follow-up (6 month) multiple med problems HPI: Emily Sanders is a 83 y.o. female presenting for Follow-up (6 month)  DM2:  BGLs not taken recently Has continued on metformin  HTN: out of med for two weeks Watching what she is eating  HLD: taking med nightly, doing well  Mood has been ok Pt she stays with has been in hospital recently  No CP, no SOB no fevers   Relevant past medical, surgical, family and social history reviewed. Allergies and medications reviewed and updated. History  Smoking Status  . Former Smoker  Smokeless Tobacco  . Never Used   ROS: Per HPI   Objective:    BP 136/70   Pulse 75   Temp 97.7 F (36.5 C) (Oral)   Ht 5\' 4"  (1.626 m)   Wt 158 lb 9.6 oz (71.9 kg)   BMI 27.22 kg/m   Wt Readings from Last 3 Encounters:  10/20/16 158 lb 9.6 oz (71.9 kg)  07/05/16 163 lb (73.9 kg)  04/04/16 163 lb (73.9 kg)    Gen: NAD, alert, cooperative with exam, NCAT EYES: EOMI, no conjunctival injection, or no icterus ENT: OP without erythema LYMPH: no cervical LAD CV: NRRR, normal V3/K1, II/VI systolic ejection murmur at upper sternal borders, distal pulses 2+ b/l Resp: CTABL, no wheezes, normal WOB Abd: +BS, soft, NTND. no guarding or organomegaly Ext: No edema, warm Neuro: Alert and oriented, sensation intact b/l feet Skin: elbows slightly rough b/l  Assessment & Plan:  Emily Sanders was seen today for follow-up.  Diagnoses and all orders for this visit:  Psoriasis Improving, cont below -     clobetasol (TEMOVATE) 0.05 % external solution; Apply 1 application topically 2 (two) times daily.  Type 2 diabetes mellitus without complication, without long-term current use of insulin (HCC) A1c most recently < 7 Recheck next visit Cont below -     glucose blood (TRUE METRIX BLOOD GLUCOSE TEST) test strip; Use as instructed -     Lancets MISC; Twice  daily -     metFORMIN (GLUCOPHAGE) 500 MG tablet; Take 1 tablet (500 mg total) by mouth 2 (two) times daily with a meal.  Essential hypertension Stable, cont below -     losartan (COZAAR) 50 MG tablet; Take 1 tablet (50 mg total) by mouth daily.  Hyperlipidemia, unspecified hyperlipidemia type Stable, cont below -     rosuvastatin (CRESTOR) 20 MG tablet; Take 1 tablet (20 mg total) by mouth at bedtime.  Stenosis of left carotid artery H/o stent L carotid, pt worried about progression, lost to f/u as surgeon moved -     VAS US CAROTID; Future  Need for immunization against influenza -     Flu Vaccine QUAD 36+ mos IM   Follow up plan: Return in about 3 months (around 01/19/2017). Emily Found, MD [place]

## 2016-10-24 ENCOUNTER — Telehealth: Payer: Self-pay | Admitting: *Deleted

## 2016-10-24 ENCOUNTER — Telehealth: Payer: Self-pay

## 2016-10-24 DIAGNOSIS — E119 Type 2 diabetes mellitus without complications: Secondary | ICD-10-CM

## 2016-10-24 NOTE — Telephone Encounter (Signed)
Pt is scheduled at [facility] on Friday 10/27/16 at 2:30

## 2016-10-24 NOTE — Telephone Encounter (Signed)
Freestyle lite or Precision Xtra are preferred strips for pt's insurance or PA required

## 2016-10-25 MED ORDER — GLUCOSE BLOOD VI STRP: ORAL_STRIP | 12 refills | Status: DC

## 2016-10-25 NOTE — Addendum Note (Signed)
Addended by: Antonietta Barcelona D on: 10/25/2016 08:58 AM   Modules accepted: Orders

## 2016-10-25 NOTE — Telephone Encounter (Signed)
Pt has a True Track meter  True metrix test strips work in it, she will pay for them out of pocket Will send Rx to [facility]

## 2016-10-27 ENCOUNTER — Ambulatory Visit (HOSPITAL_COMMUNITY): Admission: RE | Admit: 2016-10-27 | Payer: Medicare Other | Source: Ambulatory Visit

## 2016-10-31 ENCOUNTER — Telehealth: Payer: Self-pay

## 2016-10-31 NOTE — Telephone Encounter (Signed)
That's fine, can you put in order?

## 2016-10-31 NOTE — Telephone Encounter (Signed)
Patient's been using True Metrix Glucose strips  Insurance prefers [employer] or [employer] xxtra

## 2016-11-01 MED ORDER — GLUCOSE BLOOD VI STRP: ORAL_STRIP | 12 refills | Status: DC

## 2016-11-01 NOTE — Addendum Note (Signed)
Addended by: Antonietta Barcelona D on: 11/01/2016 11:33 AM   Modules accepted: Orders

## 2016-11-01 NOTE — Telephone Encounter (Signed)
Freestyle lite strips sent to pharmacy

## 2016-11-14 ENCOUNTER — Telehealth: Payer: Self-pay | Admitting: Pediatrics

## 2016-11-14 DIAGNOSIS — E119 Type 2 diabetes mellitus without complications: Secondary | ICD-10-CM

## 2016-11-14 MED ORDER — GLUCOSE BLOOD VI STRP: ORAL_STRIP | 12 refills | Status: DC

## 2016-11-14 MED ORDER — BLOOD GLUCOSE MONITOR SYSTEM W/DEVICE KIT: 1.0000 | PACK | Freq: Two times a day (BID) | 0 refills | Status: AC

## 2016-11-14 MED ORDER — LANCETS MISC: 11 refills | Status: DC

## 2016-11-14 NOTE — Telephone Encounter (Signed)
Spoke to pharmacy and they just needed new rx for glucometer, test strips and lancets sent over. Rx sent to pharmacy as requested.

## 2016-11-22 ENCOUNTER — Telehealth: Payer: Self-pay | Admitting: Pediatrics

## 2016-11-22 DIAGNOSIS — E119 Type 2 diabetes mellitus without complications: Secondary | ICD-10-CM

## 2016-11-22 MED ORDER — LANCETS MISC: 11 refills | Status: DC

## 2016-11-22 MED ORDER — GLUCOSE BLOOD VI STRP: ORAL_STRIP | 12 refills | Status: DC

## 2016-11-22 NOTE — Telephone Encounter (Signed)
Brand on test strips and lancets need to be Free style Lite RX changed and sent into the pharmacy

## 2016-12-16 ENCOUNTER — Telehealth: Payer: Self-pay | Admitting: *Deleted

## 2016-12-16 NOTE — Telephone Encounter (Signed)
Patient canceled appt for Dopplers

## 2017-02-15 ENCOUNTER — Other Ambulatory Visit: Payer: Self-pay | Admitting: Pediatrics

## 2017-02-15 DIAGNOSIS — I10 Essential (primary) hypertension: Secondary | ICD-10-CM

## 2017-02-16 NOTE — Telephone Encounter (Signed)
Next OV 02/23/17

## 2017-02-23 ENCOUNTER — Ambulatory Visit (INDEPENDENT_AMBULATORY_CARE_PROVIDER_SITE_OTHER): Payer: Medicare Other | Admitting: Pediatrics

## 2017-02-23 ENCOUNTER — Encounter: Payer: Self-pay | Admitting: Pediatrics

## 2017-02-23 VITALS — BP 136/78 | HR 65 | Temp 97.0°F | Ht 64.0 in | Wt 158.2 lb

## 2017-02-23 DIAGNOSIS — I10 Essential (primary) hypertension: Secondary | ICD-10-CM | POA: Diagnosis not present

## 2017-02-23 DIAGNOSIS — E119 Type 2 diabetes mellitus without complications: Secondary | ICD-10-CM | POA: Diagnosis not present

## 2017-02-23 DIAGNOSIS — R011 Cardiac murmur, unspecified: Secondary | ICD-10-CM | POA: Diagnosis not present

## 2017-02-23 LAB — BAYER DCA HB A1C WAIVED: HB A1C (BAYER DCA - WAIVED): 6.8 % (ref <7.0)

## 2017-02-23 NOTE — Patient Instructions (Signed)
If you do not hear about scheduling for ultrasound of your carotids and heart within the week, call us back.

## 2017-02-23 NOTE — Progress Notes (Signed)
  Subjective:   Patient ID: Emily Sanders, female    DOB: December 22, 1930, 84 y.o.   MRN: [id number] CC: Follow-up (3 month) multiple med problems HPI: Emily Sanders is a 84 y.o. female presenting for Follow-up (3 month)  Carotid artery stenosis: lost to follow up, not yet gotten [place]  HTN: taking losartan regularly No CP, HA, vision changes  DM2: when she checks at home BGLs low 100s in the morning  No lightheadedness, no swelling in LE, no change in exercise tolerance, does not get tired with exercise, feels like she can do whatever she wants to [facility] regularly  Some tenderness L side of neck, no masses Has had some runny nose, scratchy throat past few days No cough, fever. Appetite has been ok Hurts when she turns her head in certain directions  HLD: taking statin regularly, no s/e  Relevant past medical, surgical, family and social history reviewed. Allergies and medications reviewed and updated. Social History   Tobacco Use  Smoking Status Former Smoker  Smokeless Tobacco Never Used   ROS: Per HPI   Objective:    BP 136/78   Pulse 65   Temp (!) 97 F (36.1 C) (Oral)   Ht '5\' 4"'$  (1.626 m)   Wt 158 lb 3.2 oz (71.8 kg)   BMI 27.15 kg/m   Wt Readings from Last 3 Encounters:  02/23/17 158 lb 3.2 oz (71.8 kg)  10/20/16 158 lb 9.6 oz (71.9 kg)  07/05/16 163 lb (73.9 kg)    Gen: NAD, alert, cooperative with exam, NCAT EYES: EOMI, no conjunctival injection, or no icterus ENT:  TMs pearly gray b/l, OP without erythema LYMPH: no cervical LAD Neck: normal thyroid, no redness or masses. Mildly tender  CV: NRRR, normal A7/O1, III/VI systolic murmur upper sternal borders, distal pulses 2+ b/l Resp: CTABL, no wheezes, normal WOB Abd: +BS, soft, NTND. no guarding or organomegaly Ext: No edema, warm Neuro: Alert and oriented, strength equal b/l UE and LE, coordination grossly normal MSK: normal muscle bulk  Assessment & Plan:  Emily Sanders was seen today for follow- med  problems.  Diagnoses and all orders for this visit:  Type 2 diabetes mellitus without complication, without long-term current use of insulin (HCC) On metformin, A1c 6.8 Cont to avoid sugary foods -     Bayer DCA Hb A1c Waived -     Microalbumin / creatinine urine ratio  Heart murmur -     ECHOCARDIOGRAM COMPLETE; Future  Essential hypertension -     BMP8+EGFR -     TSH  L carotid stenosis Doppler ordered last visit, will get scheduled  Follow up plan: Return for 3-6 mo for follow up. Sooner if throat soreness does not improve Emily Found, MD [place]

## 2017-02-24 LAB — BMP8+EGFR
BUN/Creatinine Ratio: 18 (ref 12–28)
BUN: 11 mg/dL (ref 8–27)
CALCIUM: 8.7 mg/dL (ref 8.7–10.3)
CO2: 27 mmol/L (ref 20–29)
CREATININE: 0.61 mg/dL (ref 0.57–1.00)
Chloride: 103 mmol/L (ref 96–106)
GFR calc Af Amer: 96 mL/min/{1.73_m2} (ref >59)
GFR, EST NON AFRICAN AMERICAN: 84 mL/min/{1.73_m2} (ref >59)
Glucose: 125 mg/dL — ABNORMAL HIGH (ref 65–99)
Potassium: 4.1 mmol/L (ref 3.5–5.2)
Sodium: 145 mmol/L — ABNORMAL HIGH (ref 134–144)

## 2017-02-24 LAB — MICROALBUMIN / CREATININE URINE RATIO
Creatinine, Urine: 113.2 mg/dL
Microalb/Creat Ratio: 9.9 mg/g{creat} (ref 0.0–30.0)
Microalbumin, Urine: 11.2 ug/mL

## 2017-02-27 ENCOUNTER — Ambulatory Visit (HOSPITAL_COMMUNITY)
Admission: RE | Admit: 2017-02-27 | Discharge: 2017-02-27 | Disposition: A | Discharge status: Home / Self Care | Payer: Medicare Other | Source: Ambulatory Visit | Attending: Pediatrics | Admitting: Pediatrics

## 2017-02-27 DIAGNOSIS — I6522 Occlusion and stenosis of left carotid artery: Secondary | ICD-10-CM | POA: Diagnosis not present

## 2017-02-27 DIAGNOSIS — I6523 Occlusion and stenosis of bilateral carotid arteries: Secondary | ICD-10-CM | POA: Insufficient documentation

## 2017-02-27 LAB — TSH: TSH: 2.27 u[IU]/mL (ref 0.450–4.500)

## 2017-02-27 LAB — SPECIMEN STATUS REPORT

## 2017-03-05 ENCOUNTER — Ambulatory Visit (HOSPITAL_COMMUNITY)
Admission: RE | Admit: 2017-03-05 | Discharge: 2017-03-05 | Disposition: A | Discharge status: Home / Self Care | Payer: Medicare Other | Source: Ambulatory Visit | Attending: Pediatrics | Admitting: Pediatrics

## 2017-03-05 DIAGNOSIS — I10 Essential (primary) hypertension: Secondary | ICD-10-CM | POA: Insufficient documentation

## 2017-03-05 DIAGNOSIS — E785 Hyperlipidemia, unspecified: Secondary | ICD-10-CM | POA: Diagnosis not present

## 2017-03-05 DIAGNOSIS — E119 Type 2 diabetes mellitus without complications: Secondary | ICD-10-CM | POA: Insufficient documentation

## 2017-03-05 DIAGNOSIS — R011 Cardiac murmur, unspecified: Secondary | ICD-10-CM | POA: Diagnosis not present

## 2017-03-05 DIAGNOSIS — I080 Rheumatic disorders of both mitral and aortic valves: Secondary | ICD-10-CM | POA: Diagnosis not present

## 2017-03-05 LAB — ECHOCARDIOGRAM COMPLETE
AOPV: 0.43 m/s
AV Area VTI index: 0.73 cm2/m2
AV Mean grad: 15 mmHg
AV Peak grad: 26 mmHg
AV VEL mean LVOT/AV: 0.45
AV pk vel: 253 cm/s
AVA: 1.32 cm2
AVAREAMEANV: 1.42 cm2
AVAREAMEANVIN: 0.78 cm2/m2
AVAREAVTI: 1.34 cm2
CHL CUP AV PEAK INDEX: 0.74
CHL CUP AV VEL: 1.32
CHL CUP DOP CALC LVOT VTI: 30.7 cm
CHL CUP MV DEC (S): 285
CHL CUP RV SYS PRESS: 28 mmHg
CHL CUP TV REG PEAK VELOCITY: 249 cm/s
DOP CAL AO MEAN VELOCITY: 178 cm/s
E decel time: 285 msec
E/e' ratio: 11.78
FS: 41 % (ref 28–44)
IV/PV OW: 1.42
LA diam index: 2.15 cm/m2
LA vol A4C: 65.2 ml
LA vol index: 35 mL/m2
LASIZE: 39 mm
LAVOL: 63.6 mL
LDCA: 3.14 cm2
LEFT ATRIUM END SYS DIAM: 39 mm
LV E/e' medial: 11.78
LV TDI E'MEDIAL: 5.55
LV dias vol: 40 mL — AB (ref 46–106)
LV e' LATERAL: 7.07 cm/s
LV sys vol index: 6 mL/m2
LVDIAVOLIN: 22 mL/m2
LVEEAVG: 11.78
LVOT SV: 96 mL
LVOT peak grad rest: 5 mmHg
LVOTD: 20 mm
LVOTPV: 108 cm/s
LVOTVTI: 0.42 cm
LVSYSVOL: 12 mL — AB (ref 14–42)
Lateral S' vel: 9.46 cm/s
MV Peak grad: 3 mmHg
MV pk A vel: 98.7 m/s
MVPKEVEL: 83.3 m/s
PW: 11.4 mm — AB (ref 0.6–1.1)
RV TAPSE: 18.7 mm
Simpson's disk: 70
Stroke v: 28 ml
TDI e' lateral: 7.07
TRMAXVEL: 249 cm/s
VTI: 73.1 cm
Valve area index: 0.73

## 2017-03-05 NOTE — Progress Notes (Signed)
*  PRELIMINARY RESULTS* Echocardiogram 2D Echocardiogram has been performed.  Emily Sanders 03/05/2017, 10:20 AM

## 2017-03-26 ENCOUNTER — Telehealth: Payer: Self-pay | Admitting: Pediatrics

## 2017-03-26 NOTE — Telephone Encounter (Signed)
lmtcb

## 2017-04-05 NOTE — Telephone Encounter (Signed)
Multiple attempts made to contact patient.  This encounter will now be closed  

## 2017-05-10 ENCOUNTER — Ambulatory Visit: Payer: Medicare Other | Admitting: Podiatry

## 2017-05-16 ENCOUNTER — Encounter: Payer: Self-pay | Admitting: Podiatry

## 2017-05-16 ENCOUNTER — Ambulatory Visit (INDEPENDENT_AMBULATORY_CARE_PROVIDER_SITE_OTHER): Payer: Medicare Other | Admitting: Podiatry

## 2017-05-16 DIAGNOSIS — L84 Corns and callosities: Secondary | ICD-10-CM

## 2017-05-16 DIAGNOSIS — E1149 Type 2 diabetes mellitus with other diabetic neurological complication: Secondary | ICD-10-CM | POA: Diagnosis not present

## 2017-05-16 DIAGNOSIS — M79674 Pain in right toe(s): Secondary | ICD-10-CM

## 2017-05-16 DIAGNOSIS — E1140 Type 2 diabetes mellitus with diabetic neuropathy, unspecified: Secondary | ICD-10-CM | POA: Diagnosis not present

## 2017-05-16 DIAGNOSIS — B351 Tinea unguium: Secondary | ICD-10-CM

## 2017-05-16 DIAGNOSIS — M79675 Pain in left toe(s): Secondary | ICD-10-CM | POA: Diagnosis not present

## 2017-05-17 NOTE — Progress Notes (Signed)
Subjective:   Patient ID: Emily Sanders, female   DOB: 84 y.o.   MRN: [id number]   HPI Long-term diabetic presents with nail disease 1-5 both feet and also painful corn callus edge of the second toe right third toe right that are painful when palpated   ROS      Objective:  Physical Exam  Vascular status intact with diminished neurological sharp dull vibratory consistent with probable low-grade neuropathy with mycotic nail infection 1-5 both feet and lesion formation right     Assessment:  Mycotic nail infection with pain 1-5 both feet with lesion formation right with at risk diabetic neuropathic changes     Plan:  Debrided nailbeds 1-5 both feet with neurogenic bleeding and lesions right with sharp steroids mentation with no echogenic bleeding noted

## 2017-05-21 ENCOUNTER — Ambulatory Visit (INDEPENDENT_AMBULATORY_CARE_PROVIDER_SITE_OTHER): Payer: Medicare Other | Admitting: Pediatrics

## 2017-05-21 ENCOUNTER — Encounter: Payer: Self-pay | Admitting: Pediatrics

## 2017-05-21 VITALS — BP 132/73 | HR 76 | Temp 97.5°F | Ht 64.0 in | Wt 162.0 lb

## 2017-05-21 DIAGNOSIS — E785 Hyperlipidemia, unspecified: Secondary | ICD-10-CM | POA: Diagnosis not present

## 2017-05-21 DIAGNOSIS — I10 Essential (primary) hypertension: Secondary | ICD-10-CM | POA: Diagnosis not present

## 2017-05-21 DIAGNOSIS — I6522 Occlusion and stenosis of left carotid artery: Secondary | ICD-10-CM

## 2017-05-21 DIAGNOSIS — I517 Cardiomegaly: Secondary | ICD-10-CM

## 2017-05-21 DIAGNOSIS — E119 Type 2 diabetes mellitus without complications: Secondary | ICD-10-CM | POA: Diagnosis not present

## 2017-05-21 NOTE — Progress Notes (Signed)
  Subjective:   Patient ID: Emily Sanders, female    DOB: 1930/10/14, 84 y.o.   MRN: [id number] CC: Follow-up echocardiogram HPI: Emily Sanders is a 84 y.o. female   Recent echocardiogram ordered for murmur: LVH present.  Mild mitral valve regurgitation.  Mild aortic valve stenosis. LV EF 60-65%.  Hypertension: Says her blood pressure stayed about where it is today.  Taking losartan regularly.  Continues to stay active.  Feels no limitation in activity levels, continues to work, sitting with elderly patients.  Walking regularly.  No shortness of breath or chest pain that limits her activities.  Feeling well overall.  Denies any swelling in her ankles or feet.  No headaches.  Diabetes: Taking metformin regularly.  Avoiding sugary foods.  Hyperlipidemia: Tolerating statin.  Relevant past medical, surgical, family and social history reviewed. Allergies and medications reviewed and updated. Social History   Tobacco Use  Smoking Status Former Smoker  Smokeless Tobacco Never Used   ROS: Per HPI   Objective:    BP 132/73   Pulse 76   Temp (!) 97.5 F (36.4 C) (Oral)   Ht 5\' 4"  (1.626 m)   Wt 162 lb (73.5 kg)   BMI 27.81 kg/m   Wt Readings from Last 3 Encounters:  05/21/17 162 lb (73.5 kg)  02/23/17 158 lb 3.2 oz (71.8 kg)  10/20/16 158 lb 9.6 oz (71.9 kg)    Gen: NAD, alert, cooperative with exam, NCAT EYES: EOMI, no conjunctival injection, or no icterus ENT:  TMs pearly gray b/l, OP without erythema LYMPH: no cervical LAD CV: NRRR, normal S1/S2, III/VI RUSB ejection murmur Resp: no wheezes, normal WOB Abd: +BS, soft, NTND.  Ext: No edema, warm Neuro: Alert and oriented, strength equal b/l UE and LE, coordination grossly normal MSK: normal muscle bulk  EKG: Heart rate 62.  Normal sinus rhythm.  Q waves present in V1-V3.  Slight widening QRS.  Assessment & Plan:  84 year old female here for follow-up multiple medical problems.  Diagnoses and all orders for this  visit:  LVH (left ventricular hypertrophy) Thickened left ventricle on echo.  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  No LVH on EKG.  Patient asymptomatic right now.  Echo obtained for heart murmur, with mild aortic stenosis on echo.  Will watch symptoms for now.  Continue to control blood pressure.  Any new symptoms we will consider further evaluation for amyloidosis. -     EKG 12-Lead  Essential hypertension Controlled, continue current medicines  Type 2 diabetes mellitus without complication, without long-term current use of insulin (HCC) Stable, continue current medicines  Hyperlipidemia, unspecified hyperlipidemia type Stable, continue statin  Follow up plan: Return in about 3 months (around 08/20/2017). Assunta Found, MD [place]

## 2017-06-25 DIAGNOSIS — L603 Nail dystrophy: Secondary | ICD-10-CM | POA: Diagnosis not present

## 2017-06-25 DIAGNOSIS — D225 Melanocytic nevi of trunk: Secondary | ICD-10-CM | POA: Diagnosis not present

## 2017-06-25 DIAGNOSIS — L400 Psoriasis vulgaris: Secondary | ICD-10-CM | POA: Diagnosis not present

## 2017-07-21 ENCOUNTER — Other Ambulatory Visit: Payer: Self-pay | Admitting: Pediatrics

## 2017-07-21 DIAGNOSIS — E785 Hyperlipidemia, unspecified: Secondary | ICD-10-CM

## 2017-07-21 DIAGNOSIS — I10 Essential (primary) hypertension: Secondary | ICD-10-CM

## 2017-07-21 DIAGNOSIS — E119 Type 2 diabetes mellitus without complications: Secondary | ICD-10-CM

## 2017-07-23 NOTE — Telephone Encounter (Signed)
OV 08/20/17

## 2017-08-15 ENCOUNTER — Encounter: Payer: Self-pay | Admitting: Podiatry

## 2017-08-15 ENCOUNTER — Ambulatory Visit (INDEPENDENT_AMBULATORY_CARE_PROVIDER_SITE_OTHER): Payer: Medicare Other | Admitting: Podiatry

## 2017-08-15 DIAGNOSIS — B351 Tinea unguium: Secondary | ICD-10-CM | POA: Diagnosis not present

## 2017-08-15 DIAGNOSIS — M79675 Pain in left toe(s): Secondary | ICD-10-CM | POA: Diagnosis not present

## 2017-08-15 DIAGNOSIS — M79674 Pain in right toe(s): Secondary | ICD-10-CM

## 2017-08-15 NOTE — Progress Notes (Signed)
Subjective:   Patient ID: Emily Sanders, female   DOB: 84 y.o.   MRN: [id number]   HPI Patient presents with thick nail disease 1-5 both feet that are yellow brittle and hard for her to cut   ROS      Objective:  Physical Exam  Mycotic thick yellow brittle nailbeds 1-5 both feet are discolored and crumbly and painful     Assessment:  Mycotic nail infection with pain 1-5 both feet     Plan:  Debrided nailbeds 1-5 both feet with no iatrogenic bleeding noted

## 2017-08-20 ENCOUNTER — Ambulatory Visit (INDEPENDENT_AMBULATORY_CARE_PROVIDER_SITE_OTHER): Payer: Medicare Other

## 2017-08-20 ENCOUNTER — Encounter: Payer: Self-pay | Admitting: Pediatrics

## 2017-08-20 ENCOUNTER — Ambulatory Visit (INDEPENDENT_AMBULATORY_CARE_PROVIDER_SITE_OTHER): Payer: Medicare Other | Admitting: Pediatrics

## 2017-08-20 VITALS — BP 132/66 | HR 63 | Temp 97.1°F | Ht 64.0 in | Wt 158.2 lb

## 2017-08-20 DIAGNOSIS — R0989 Other specified symptoms and signs involving the circulatory and respiratory systems: Secondary | ICD-10-CM

## 2017-08-20 DIAGNOSIS — I350 Nonrheumatic aortic (valve) stenosis: Secondary | ICD-10-CM

## 2017-08-20 DIAGNOSIS — E119 Type 2 diabetes mellitus without complications: Secondary | ICD-10-CM

## 2017-08-20 DIAGNOSIS — E785 Hyperlipidemia, unspecified: Secondary | ICD-10-CM

## 2017-08-20 DIAGNOSIS — J849 Interstitial pulmonary disease, unspecified: Secondary | ICD-10-CM | POA: Diagnosis not present

## 2017-08-20 DIAGNOSIS — I10 Essential (primary) hypertension: Secondary | ICD-10-CM | POA: Diagnosis not present

## 2017-08-20 DIAGNOSIS — I6522 Occlusion and stenosis of left carotid artery: Secondary | ICD-10-CM

## 2017-08-20 LAB — BAYER DCA HB A1C WAIVED: HB A1C (BAYER DCA - WAIVED): 7 % — ABNORMAL HIGH (ref <7.0)

## 2017-08-20 MED ORDER — LOSARTAN POTASSIUM 25 MG PO TABS: 25.0000 mg | ORAL_TABLET | Freq: Every day | ORAL | 3 refills | Status: DC

## 2017-08-20 NOTE — Progress Notes (Signed)
  Subjective:   Patient ID: Emily Sanders, female    DOB: [birth year], 84 y.o.   MRN: [id number] CC: Medical Management of Chronic Issues  HPI: Emily Sanders is a 84 y.o. female   DM2: She has been eating more sugary foods over the last 2 months.  She is pleased with her weight being down.  She is taking her medicine regularly.  Aortic stenosis:   H/o smoking years ago, < 30 pack years  HTN: BPs at home 110s-130s. Did not take med this morning. No lightheadedness, SOB.  She says her blood pressure is always been on the low side.  She takes losartan 50 mg in the morning.  No shortness of breath, exercise intolerance.  She is walking regularly daily.  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  Relevant past medical, surgical, family and social history reviewed. Allergies and medications reviewed and updated. Social History   Tobacco Use  Smoking Status Former Smoker  Smokeless Tobacco Never Used   ROS: Per HPI   Objective:    BP 132/66   Pulse 63   Temp (!) 97.1 F (36.2 C) (Oral)   Ht 5\' 4"  (1.626 m)   Wt 158 lb 3.2 oz (71.8 kg)   BMI 27.15 kg/m   Wt Readings from Last 3 Encounters:  08/20/17 158 lb 3.2 oz (71.8 kg)  05/21/17 162 lb (73.5 kg)  02/23/17 158 lb 3.2 oz (71.8 kg)    Gen: NAD, alert, cooperative with exam, NCAT EYES: EOMI, no conjunctival injection, or no icterus CV: NRRR, III/VI systolic ejection murmur heard throughout, loudest at right upper sternal border.  distal pulses 2+ b/l Resp: Expiratory and expiratory right lower lobe.  Rales present bilateral lower lobes.  normal WOB Abd: +BS, soft, NTND. no guarding or organomegaly Ext: No edema, warm Neuro: Alert and oriented, strength equal b/l UE and LE, coordination grossly normal MSK: normal muscle bulk  Assessment & Plan:  [name] was seen today for medical management of chronic issues.  Diagnoses and all orders for this visit:  Type 2 diabetes mellitus  without complication, without long-term current use of insulin (HCC) A1c of 7.1.  Continue current medicines.  Avoid sugary foods as able. -     Bayer DCA Hb A1c Waived  Essential hypertension Stable, usually 110s to 120s at home. -     losartan (COZAAR) 25 MG tablet; Take 1 tablet (25 mg total) by mouth daily.  Hyperlipidemia, unspecified hyperlipidemia type Stable, continue rosuvastatin.  Bilateral rales -     DG Chest 2 View; Future  Aortic valve stenosis, etiology of cardiac valve disease unspecified -     Ambulatory referral to Cardiology   Follow up plan: Return in about 3 months (around 11/20/2017). Assunta Found, MD [place]

## 2017-09-21 ENCOUNTER — Encounter (INDEPENDENT_AMBULATORY_CARE_PROVIDER_SITE_OTHER): Payer: Self-pay

## 2017-09-21 ENCOUNTER — Ambulatory Visit (INDEPENDENT_AMBULATORY_CARE_PROVIDER_SITE_OTHER): Payer: Medicare Other | Admitting: Cardiovascular Disease

## 2017-09-21 ENCOUNTER — Encounter: Payer: Self-pay | Admitting: Cardiovascular Disease

## 2017-09-21 VITALS — BP 156/79 | HR 62 | Ht 64.0 in | Wt 160.2 lb

## 2017-09-21 DIAGNOSIS — E785 Hyperlipidemia, unspecified: Secondary | ICD-10-CM

## 2017-09-21 DIAGNOSIS — I10 Essential (primary) hypertension: Secondary | ICD-10-CM | POA: Diagnosis not present

## 2017-09-21 DIAGNOSIS — I6523 Occlusion and stenosis of bilateral carotid arteries: Secondary | ICD-10-CM | POA: Diagnosis not present

## 2017-09-21 DIAGNOSIS — I350 Nonrheumatic aortic (valve) stenosis: Secondary | ICD-10-CM

## 2017-09-21 NOTE — Patient Instructions (Signed)

## 2017-09-21 NOTE — Progress Notes (Signed)
CARDIOLOGY CONSULT NOTE  Patient ID: BIBIANA GILLEAN MRN: [id number] DOB/AGE: 82-27-1932 82 y.o.  Admit date: (Not on file) Primary Physician: Eustaquio Maize, MD Referring Physician: Eustaquio Maize, MD  Reason for Consultation: Aortic stenosis  HPI: Emily Sanders is a 84 y.o. female who is being seen today for the evaluation of aortic stenosis at the request of Eustaquio Maize, MD.   Past medical history includes left carotid stent, hypertension, hyperlipidemia, and type 2 diabetes mellitus.  I reviewed a chest x-ray performed on 08/20/2017 which showed persistent diffuse interstitial lung disease and biapical scarring.  I reviewed an echocardiogram dated 03/05/2017 which showed normal left ventricular systolic function and regional wall motion, LVEF 60 to 65%, moderate to severe LVH, grade 1 diastolic dysfunction, moderately thickened aortic valve leaflets with mild stenosis, calculated valve area 1.62 cm, mild to moderate mitral annular calcification with mildly thickened leaflets and mild mitral regurgitation, moderate left atrial dilatation, mild right atrial dilatation, and mild right ventricular enlargement.  I personally reviewed an ECG performed on 05/21/2017 which showed sinus rhythm with right bundle branch block and late R wave transition with nonspecific T wave abnormalities.  Hemoglobin A1c 7% on 08/20/2017.  Renal function was normal on 02/23/2017.  Thyroid function was also normal.  She underwent left internal carotid artery stenting at [facility] in 2007.  Carotid Dopplers on 02/27/2017 showed less than 50% left internal carotid artery stenosis and 50 to 69% right internal carotid artery stenosis.  She very seldom has sharp chest pains over the left breast lasting a second at most.  She denies exertional chest tightness.  She also denies exertional dyspnea as well as palpitations, leg swelling, dizziness, and syncope.  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  She adheres to a very healthy diet and eats oatmeal, also sprouts, smoked herron, soda crackers, bananas, and blueberries.  Social history: She is originally from [place], [place].  She moved to [place] over 20 years ago.  She has 6 adult children.  To live in [place], one in the state of [place], and one in [place].    No Known Allergies  Current Outpatient Medications  Medication Sig Dispense Refill  . aspirin 81 MG tablet Take 81 mg by mouth daily.    . Blood Glucose Monitoring Suppl (BLOOD GLUCOSE MONITOR SYSTEM) w/Device KIT 1 Device by Does not apply route 2 (two) times daily. 1 each 0  . Cholecalciferol (VITAMIN D3 SUPER STRENGTH) 2000 units TABS Take 1 tablet by mouth daily.    . clobetasol (TEMOVATE) 0.05 % external solution Apply 1 application topically 2 (two) times daily. 50 mL 0  . glucose blood test strip Check blood glucose twice daily 100 each 12  . Lancets MISC Twice daily 100 each 11  . losartan (COZAAR) 25 MG tablet Take 1 tablet (25 mg total) by mouth daily. 90 tablet 3  . metFORMIN (GLUCOPHAGE) 500 MG tablet TAKE 1 TABLET BY MOUTH TWICE DAILY WITH  A  MEAL 180 tablet 0  . rosuvastatin (CRESTOR) 20 MG tablet TAKE 1 TABLET BY MOUTH AT BEDTIME 90 tablet 0  . multivitamin-lutein (OCUVITE-LUTEIN) CAPS capsule Take 1 capsule by mouth daily.     No current facility-administered medications for this visit.     Past Medical History:  Diagnosis Date  . Diabetes mellitus without complication ([place])   . Hyperlipidemia   . Hypertension   . Psoriasis     Past  Surgical History:  Procedure Laterality Date  . ABDOMINAL HYSTERECTOMY    . APPENDECTOMY    . BLADDER REPAIR    . CAROTID STENT INSERTION Left   . CHOLECYSTECTOMY      Social History   Socioeconomic History  . Marital status: Widowed    Spouse name: Not on file  . Number of children: Not on file  . Years of education: Not on file  . Highest education level: Not on file   Occupational History  . Not on file  Social Needs  . Financial resource strain: Not on file  . Food insecurity:    Worry: Not on file    Inability: Not on file  . Transportation needs:    Medical: Not on file    Non-medical: Not on file  Tobacco Use  . Smoking status: Former Research scientist (life sciences)  . Smokeless tobacco: Never Used  Substance and Sexual Activity  . Alcohol use: No  . Drug use: No  . Sexual activity: Not on file  Lifestyle  . Physical activity:    Days per week: Not on file    Minutes per session: Not on file  . Stress: Not on file  Relationships  . Social connections:    Talks on phone: Not on file    Gets together: Not on file    Attends religious service: Not on file    Active member of club or organization: Not on file    Attends meetings of clubs or organizations: Not on file    Relationship status: Not on file  . Intimate partner violence:    Fear of current or ex partner: Not on file    Emotionally abused: Not on file    Physically abused: Not on file    Forced sexual activity: Not on file  Other Topics Concern  . Not on file  Social History Narrative  . Not on file     No family history of premature CAD in 1st degree relatives.  Current Meds  Medication Sig  . aspirin 81 MG tablet Take 81 mg by mouth daily.  . Blood Glucose Monitoring Suppl (BLOOD GLUCOSE MONITOR SYSTEM) w/Device KIT 1 Device by Does not apply route 2 (two) times daily.  . Cholecalciferol (VITAMIN D3 SUPER STRENGTH) 2000 units TABS Take 1 tablet by mouth daily.  . clobetasol (TEMOVATE) 0.05 % external solution Apply 1 application topically 2 (two) times daily.  Marland Kitchen glucose blood test strip Check blood glucose twice daily  . Lancets MISC Twice daily  . losartan (COZAAR) 25 MG tablet Take 1 tablet (25 mg total) by mouth daily.  . metFORMIN (GLUCOPHAGE) 500 MG tablet TAKE 1 TABLET BY MOUTH TWICE DAILY WITH  A  MEAL  . rosuvastatin (CRESTOR) 20 MG tablet TAKE 1 TABLET BY MOUTH AT BEDTIME       Review of systems complete and found to be negative unless listed above in HPI    Physical exam Blood pressure (!) 156/79, pulse 62, height _0  (1.626 m), weight 160 lb 3.2 oz (72.7 kg), SpO2 96 %. General: NAD Neck: No JVD, no thyromegaly or thyroid nodule.  Lungs: Clear to auscultation bilaterally with normal respiratory effort. CV: Nondisplaced PMI. Regular rate and rhythm, normal S1/S2, no S3/S4, 2/6 ejection systolic murmur over right upper sternal border.  No peripheral edema.  Soft left carotid bruit and louder right carotid bruit.    Abdomen: Soft, nontender, no distention.  Skin: Intact without lesions or rashes.  Neurologic: Alert and  oriented x 3.  Psych: Normal affect. Extremities: No clubbing or cyanosis.  HEENT: Normal.   ECG: Most recent ECG reviewed.   Labs: Lab Results  Component Value Date/Time   K 4.1 02/23/2017 10:16 AM   BUN 11 02/23/2017 10:16 AM   CREATININE 0.61 02/23/2017 10:16 AM   ALT 20 04/04/2016 03:37 PM   TSH 2.270 02/23/2017 10:16 AM   HGB 13.3 12/29/2008 09:15 AM     Lipids: Lab Results  Component Value Date/Time   LDLCALC 90 04/04/2016 03:37 PM   CHOL 155 04/04/2016 03:37 PM   TRIG 138 04/04/2016 03:37 PM   HDL 37 (L) 04/04/2016 03:37 PM        ASSESSMENT AND PLAN:  1.  Aortic stenosis: Mild in severity.  I will monitor clinically and with routine echocardiographic surveillance.  She is a symptomatically in the standpoint.  2.  Bilateral internal carotid artery stenosis: Dopplers from February 2019 reviewed above.  Left internal carotid artery stent placed in 2007.  Dopplers should be repeated in February 2020.  She is on aspirin and rosuvastatin.  3.  Hypertension: Blood pressure is elevated today but it had been lower in the past.  She was a bit anxious coming to our office.  No changes to therapy at this time.  4.  Hyperlipidemia: LDL 90 on 04/04/2016.  Continue rosuvastatin 20 mg.     Disposition: Follow up in 1  year  Signed: Kate Sable, M.D., F.A.C.C.  09/21/2017, 8:40 AM

## 2017-11-13 ENCOUNTER — Other Ambulatory Visit: Payer: Self-pay | Admitting: Pediatrics

## 2017-11-13 DIAGNOSIS — E119 Type 2 diabetes mellitus without complications: Secondary | ICD-10-CM

## 2017-11-13 DIAGNOSIS — E785 Hyperlipidemia, unspecified: Secondary | ICD-10-CM

## 2017-11-14 ENCOUNTER — Ambulatory Visit (INDEPENDENT_AMBULATORY_CARE_PROVIDER_SITE_OTHER): Payer: Medicare Other | Admitting: Podiatry

## 2017-11-14 DIAGNOSIS — B351 Tinea unguium: Secondary | ICD-10-CM | POA: Diagnosis not present

## 2017-11-14 DIAGNOSIS — M79674 Pain in right toe(s): Secondary | ICD-10-CM

## 2017-11-14 DIAGNOSIS — M79675 Pain in left toe(s): Secondary | ICD-10-CM

## 2017-11-14 NOTE — Telephone Encounter (Signed)
Last lipid 3.13.18

## 2017-11-20 ENCOUNTER — Other Ambulatory Visit: Payer: Self-pay

## 2017-11-20 DIAGNOSIS — E119 Type 2 diabetes mellitus without complications: Secondary | ICD-10-CM

## 2017-11-20 DIAGNOSIS — E785 Hyperlipidemia, unspecified: Secondary | ICD-10-CM

## 2017-11-20 MED ORDER — ROSUVASTATIN CALCIUM 20 MG PO TABS: 20.0000 mg | ORAL_TABLET | Freq: Every day | ORAL | 0 refills | Status: DC

## 2017-11-20 MED ORDER — METFORMIN HCL 500 MG PO TABS: ORAL_TABLET | ORAL | 0 refills | Status: DC

## 2017-11-20 NOTE — Telephone Encounter (Signed)
Last lipid 04/04/16  Dr Evette Doffing

## 2017-11-20 NOTE — Telephone Encounter (Signed)
Last seen 08/20/17  Dr Evette Doffing

## 2017-11-21 ENCOUNTER — Ambulatory Visit (INDEPENDENT_AMBULATORY_CARE_PROVIDER_SITE_OTHER): Payer: Medicare Other

## 2017-11-21 ENCOUNTER — Encounter: Payer: Self-pay | Admitting: Pediatrics

## 2017-11-21 ENCOUNTER — Ambulatory Visit (INDEPENDENT_AMBULATORY_CARE_PROVIDER_SITE_OTHER): Payer: Medicare Other | Admitting: Pediatrics

## 2017-11-21 VITALS — BP 120/64 | HR 75 | Temp 97.3°F | Ht 64.0 in | Wt 158.0 lb

## 2017-11-21 DIAGNOSIS — Z13820 Encounter for screening for osteoporosis: Secondary | ICD-10-CM | POA: Diagnosis not present

## 2017-11-21 DIAGNOSIS — E785 Hyperlipidemia, unspecified: Secondary | ICD-10-CM

## 2017-11-21 DIAGNOSIS — I10 Essential (primary) hypertension: Secondary | ICD-10-CM | POA: Diagnosis not present

## 2017-11-21 DIAGNOSIS — I6522 Occlusion and stenosis of left carotid artery: Secondary | ICD-10-CM | POA: Diagnosis not present

## 2017-11-21 DIAGNOSIS — Z780 Asymptomatic menopausal state: Secondary | ICD-10-CM

## 2017-11-21 DIAGNOSIS — Z23 Encounter for immunization: Secondary | ICD-10-CM

## 2017-11-21 DIAGNOSIS — E119 Type 2 diabetes mellitus without complications: Secondary | ICD-10-CM | POA: Diagnosis not present

## 2017-11-21 LAB — BAYER DCA HB A1C WAIVED: HB A1C: 7.2 % — AB (ref <7.0)

## 2017-11-21 NOTE — Progress Notes (Signed)
  Subjective:   Patient ID: Emily Sanders, female    DOB: [birth year], 84 y.o.   MRN: [id number] CC: Medical Management of Chronic Issues  HPI: Emily Sanders is a 84 y.o. female   DM2: avoiding sugary foods.  Taking metformin regularly.  Seen by podiatry.  They gave her inserts for her slight hammertoes bilateral second toe.  Carotid stenosis: Will be due for repeat Dopplers February 2020  Hyperlipidemia: Tolerating Crestor.  No side effects.  Hypertension: Blood pressures at home usually about the same as here.  No lightheadedness or dizziness.  Postmenopausal: Has had DEXA scan she thinks years ago, does not remember exactly what results were.  Remains fairly active.  No shortness of breath with exertion.  Does not feel limited with activities.  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  Relevant past medical, surgical, family and social history reviewed. Allergies and medications reviewed and updated. Social History   Tobacco Use  Smoking Status Former Smoker  Smokeless Tobacco Never Used   ROS: Per HPI   Objective:    BP 120/64   Pulse 75   Temp (!) 97.3 F (36.3 C) (Oral)   Ht '5\' 4"'$  (1.626 m)   Wt 158 lb (71.7 kg)   BMI 27.12 kg/m   Wt Readings from Last 3 Encounters:  11/21/17 158 lb (71.7 kg)  09/21/17 160 lb 3.2 oz (72.7 kg)  08/20/17 158 lb 3.2 oz (71.8 kg)    Gen: NAD, alert, cooperative with exam, NCAT EYES: EOMI, no conjunctival injection, or no icterus CV: NRRR, normal S1/S2, III/VI RUSB systolic ejection murmur, distal pulses 2+ b/l Resp: slight crackles RLL, unchanged, no wheezes, normal WOB Abd: +BS, soft, NTND. no guarding or organomegaly Ext: No edema, warm Neuro: Alert and oriented, strength equal b/l UE and LE, coordination grossly normal MSK: normal muscle bulk  Assessment &  Plan:  Jazia was seen today for medical management of chronic issues.  Diagnoses and all orders for this visit:  Type 2 diabetes mellitus without complication, without long-term current use of insulin (HCC) -     BMP8+EGFR -     Lipid panel -     Bayer DCA Hb A1c Waived  Essential hypertension -     BMP8+EGFR -     Lipid panel -     Bayer DCA Hb A1c Waived  Hyperlipidemia, unspecified hyperlipidemia type -     BMP8+EGFR -     Lipid panel -     Bayer DCA Hb A1c Waived  Post-menopausal -     DG [facility] DEXA   Follow up plan: Return in about 3 months (around 02/21/2018). Assunta Found, MD [place]

## 2017-11-22 LAB — LIPID PANEL
CHOLESTEROL TOTAL: 95 mg/dL — AB (ref 100–199)
Chol/HDL Ratio: 2.4 ratio (ref 0.0–4.4)
HDL: 40 mg/dL (ref >39)
LDL CALC: 34 mg/dL (ref 0–99)
Triglycerides: 106 mg/dL (ref 0–149)
VLDL CHOLESTEROL CAL: 21 mg/dL (ref 5–40)

## 2017-11-22 LAB — BMP8+EGFR
BUN/Creatinine Ratio: 19 (ref 12–28)
BUN: 15 mg/dL (ref 8–27)
CO2: 28 mmol/L (ref 20–29)
CREATININE: 0.78 mg/dL (ref 0.57–1.00)
Calcium: 9.2 mg/dL (ref 8.7–10.3)
Chloride: 102 mmol/L (ref 96–106)
GFR calc Af Amer: 80 mL/min/{1.73_m2} (ref >59)
GFR calc non Af Amer: 70 mL/min/{1.73_m2} (ref >59)
GLUCOSE: 134 mg/dL — AB (ref 65–99)
Potassium: 4.7 mmol/L (ref 3.5–5.2)
SODIUM: 143 mmol/L (ref 134–144)

## 2017-11-26 ENCOUNTER — Encounter: Payer: Self-pay | Admitting: Podiatry

## 2017-11-26 NOTE — Progress Notes (Signed)
Subjective: "These toenails are hurting me again."  Emily Sanders presents today for follow up of painful mycotic toenails b/l feet which interfere with daily activities and routine tasks.  Pain is aggravated when wearing enclosed shoe gear. Pain is getting progressively worse and relieved with periodic professional debridement.  She does have h/o diabetes for which she takes Metformin.  She is a former smoker.  [name] denies any new pedal problems on today's visit.  Objective: Vascular Examination: Capillary refill time <3 seconds x 10 digits Dorsalis pedis and posterior tibial pulses present b/l No digital hair x 10 digits Skin temperature warm to warm b/l  Dermatological Examination: Skin with normal turgor, texture and tone b/l Toenails 1-5 b/l discolored, thick, dystrophic with subungual debris and pain with palpation to nailbeds due to thickness of nails. No open wounds No interdigital macerations No hyperkeratotic lesions  Musculoskeletal: Muscle strength 5/5 to all LE muscle groups  Neurological: Sensation intact with 10 gram monofilament. Vibratory sensation intact.  Assessment: Painful onychomycosis toenails 1-5 b/l   Plan: 1. Toenails 1-5 b/l were debrided in length and girth without iatrogenic bleeding. 2. Patient to continue soft, supportive shoe gear 3. Patient to report any pedal injuries to medical professional immediately. 4. Follow up 3 months. Patient/POA to call should there be a concern in the interim.

## 2018-02-08 DIAGNOSIS — Z0000 Encounter for general adult medical examination without abnormal findings: Secondary | ICD-10-CM | POA: Diagnosis not present

## 2018-02-14 ENCOUNTER — Ambulatory Visit (INDEPENDENT_AMBULATORY_CARE_PROVIDER_SITE_OTHER): Payer: Medicare Other | Admitting: Podiatry

## 2018-02-14 ENCOUNTER — Encounter: Payer: Self-pay | Admitting: Podiatry

## 2018-02-14 DIAGNOSIS — M79674 Pain in right toe(s): Secondary | ICD-10-CM | POA: Diagnosis not present

## 2018-02-14 DIAGNOSIS — B351 Tinea unguium: Secondary | ICD-10-CM | POA: Diagnosis not present

## 2018-02-14 DIAGNOSIS — M79675 Pain in left toe(s): Secondary | ICD-10-CM | POA: Diagnosis not present

## 2018-02-14 NOTE — Progress Notes (Signed)
Subjective: Emily Sanders presents today with painful, thick toenails 1-5 b/l that she cannot cut and which interfere with daily activities.  Pain is aggravated when wearing enclosed shoe gear.  She is requesting additional Silipos Toe caps.     Current Outpatient Medications:  .  aspirin 81 MG tablet, Take 81 mg by mouth daily., Disp: , Rfl:  .  Blood Glucose Monitoring Suppl (BLOOD GLUCOSE MONITOR SYSTEM) w/Device KIT, 1 Device by Does not apply route 2 (two) times daily., Disp: 1 each, Rfl: 0 .  Cholecalciferol (VITAMIN D3 SUPER STRENGTH) 2000 units TABS, Take 1 tablet by mouth daily., Disp: , Rfl:  .  clobetasol (TEMOVATE) 0.05 % external solution, Apply 1 application topically 2 (two) times daily., Disp: 50 mL, Rfl: 0 .  glucose blood test strip, Check blood glucose twice daily, Disp: 100 each, Rfl: 12 .  Lancets MISC, Twice daily, Disp: 100 each, Rfl: 11 .  losartan (COZAAR) 25 MG tablet, Take 1 tablet (25 mg total) by mouth daily., Disp: 90 tablet, Rfl: 3 .  metFORMIN (GLUCOPHAGE) 500 MG tablet, TAKE 1 TABLET BY MOUTH TWICE DAILY WITH A MEAL, Disp: 180 tablet, Rfl: 0 .  multivitamin-lutein (OCUVITE-LUTEIN) CAPS capsule, Take 1 capsule by mouth daily., Disp: , Rfl:  .  rosuvastatin (CRESTOR) 20 MG tablet, TAKE 1 TABLET BY MOUTH AT BEDTIME, Disp: 90 tablet, Rfl: 0  No Known Allergies  Objective:  Neurovascular status unchanged  Dermatological Examination: Skin with normal turgor, texture and tone b/l  Toenails 1-5 b/l discolored, thick, dystrophic with subungual debris and pain with palpation to nailbeds due to thickness of nails.  Musculoskeletal: Muscle strength 5/5 to all LE muscle groups  No gross bony deformities b/l.  No pain, crepitus or joint limitation noted with ROM.   Assessment: Painful onychomycosis toenails 1-5 b/l   Plan: 1. Toenails 1-5 b/l were debrided in length and girth without iatrogenic bleeding. 2. Dispensed silicone 3. Patient to continue  soft, supportive shoe gear 4. Patient to report any pedal injuries to medical professional immediately. 5. Follow up 3 months. Patient/POA to call should there be a concern in the interim.

## 2018-02-14 NOTE — Patient Instructions (Signed)

## 2018-02-22 ENCOUNTER — Ambulatory Visit (INDEPENDENT_AMBULATORY_CARE_PROVIDER_SITE_OTHER): Payer: Medicare Other | Admitting: Family Medicine

## 2018-02-22 ENCOUNTER — Encounter: Payer: Self-pay | Admitting: Family Medicine

## 2018-02-22 ENCOUNTER — Ambulatory Visit: Payer: Medicare Other | Admitting: Pediatrics

## 2018-02-22 VITALS — BP 138/64 | HR 60 | Temp 97.2°F | Ht 64.0 in | Wt 157.0 lb

## 2018-02-22 DIAGNOSIS — R143 Flatulence: Secondary | ICD-10-CM | POA: Insufficient documentation

## 2018-02-22 DIAGNOSIS — E785 Hyperlipidemia, unspecified: Secondary | ICD-10-CM | POA: Diagnosis not present

## 2018-02-22 DIAGNOSIS — E1159 Type 2 diabetes mellitus with other circulatory complications: Secondary | ICD-10-CM

## 2018-02-22 DIAGNOSIS — Z23 Encounter for immunization: Secondary | ICD-10-CM | POA: Diagnosis not present

## 2018-02-22 DIAGNOSIS — I350 Nonrheumatic aortic (valve) stenosis: Secondary | ICD-10-CM | POA: Diagnosis not present

## 2018-02-22 DIAGNOSIS — I10 Essential (primary) hypertension: Secondary | ICD-10-CM

## 2018-02-22 DIAGNOSIS — E1169 Type 2 diabetes mellitus with other specified complication: Secondary | ICD-10-CM

## 2018-02-22 DIAGNOSIS — E119 Type 2 diabetes mellitus without complications: Secondary | ICD-10-CM

## 2018-02-22 MED ORDER — LOSARTAN POTASSIUM 25 MG PO TABS: 25.0000 mg | ORAL_TABLET | Freq: Every day | ORAL | 1 refills | Status: DC

## 2018-02-22 MED ORDER — ROSUVASTATIN CALCIUM 20 MG PO TABS: 20.0000 mg | ORAL_TABLET | Freq: Every day | ORAL | 1 refills | Status: DC

## 2018-02-22 MED ORDER — METFORMIN HCL 500 MG PO TABS: ORAL_TABLET | ORAL | 1 refills | Status: DC

## 2018-02-22 NOTE — Progress Notes (Signed)
Subjective: CC: DM PCP: Janora Norlander, DO TXM:IWOEHOZYY E Emily Sanders is a 85 y.o. female presenting to clinic today for:  1. Type 2 Diabetes with hypertension and hyperlipidemia:  Diagnosed about 13 years ago.  Patient reports that she measures blood sugars regularly and they are never greater than 100.  She does report some increased sugar intake over the holidays.  She wonders if this affected her blood sugars.  She has not had her diabetic eye exam yet but is planning on scheduling this soon.  She sees my eye doctor in [place].  She reports compliance with metformin, Crestor and losartan.  She needs refills on all 3 of these.  She had fasting labs recently which indicated controlled cholesterol.  Last eye exam: due Last foot exam: 11/21/2017 Last A1c:  Lab Results  Component Value Date   HGBA1C 7.2 (H) 11/21/2017   Nephropathy screen indicated?: on ARB Last flu, zoster and/or pneumovax:  Due for PNA Immunization History  Administered Date(s) Administered  . Influenza, High Dose Seasonal PF 11/21/2017  . Influenza,inj,Quad PF,6+ Mos 10/20/2016    ROS: Denies  dizziness, LOC, polyuria, polydipsia, unintended weight loss/gain, foot ulcerations, numbness or tingling in extremities or chest pain.  2. Heart murmur Patient reports that she was seen by cardiology last year for heart murmur.  She still unsure as to what her diagnosis is.  She does not recall ever having an echocardiogram but would like to further evaluate this if possible.  Denies any shortness of breath, lower extremity edema or orthopnea.  No change in exercise or activity tolerance.  She currently works 4 hours/day as a Quarry manager.  3.  Gas Patient reports quite a bit of bloating, particularly related to consumption of beans and various vegetables.  She is wondering if there is anything that she can do to help improve the symptoms as she very much enjoys eating vegetables.  ROS: Per HPI  No Known Allergies Past Medical  History:  Diagnosis Date  . Diabetes mellitus without complication ([place])   . Hyperlipidemia   . Hypertension   . Psoriasis     Current Outpatient Medications:  .  aspirin 81 MG tablet, Take 81 mg by mouth daily., Disp: , Rfl:  .  Blood Glucose Monitoring Suppl (BLOOD GLUCOSE MONITOR SYSTEM) w/Device KIT, 1 Device by Does not apply route 2 (two) times daily., Disp: 1 each, Rfl: 0 .  Cholecalciferol (VITAMIN D3 SUPER STRENGTH) 2000 units TABS, Take 1 tablet by mouth daily., Disp: , Rfl:  .  clobetasol (TEMOVATE) 0.05 % external solution, Apply 1 application topically 2 (two) times daily., Disp: 50 mL, Rfl: 0 .  glucose blood test strip, Check blood glucose twice daily, Disp: 100 each, Rfl: 12 .  Lancets MISC, Twice daily, Disp: 100 each, Rfl: 11 .  losartan (COZAAR) 25 MG tablet, Take 1 tablet (25 mg total) by mouth daily., Disp: 90 tablet, Rfl: 3 .  metFORMIN (GLUCOPHAGE) 500 MG tablet, TAKE 1 TABLET BY MOUTH TWICE DAILY WITH A MEAL, Disp: 180 tablet, Rfl: 0 .  multivitamin-lutein (OCUVITE-LUTEIN) CAPS capsule, Take 1 capsule by mouth daily., Disp: , Rfl:  .  rosuvastatin (CRESTOR) 20 MG tablet, TAKE 1 TABLET BY MOUTH AT BEDTIME, Disp: 90 tablet, Rfl: 0 Social History   Socioeconomic History  . Marital status: Widowed    Spouse name: Not on file  . Number of children: Not on file  . Years of education: Not on file  . Highest education level: Not on  file  Occupational History  . Not on file  Social Needs  . Financial resource strain: Not on file  . Food insecurity:    Worry: Not on file    Inability: Not on file  . Transportation needs:    Medical: Not on file    Non-medical: Not on file  Tobacco Use  . Smoking status: Former Research scientist (life sciences)  . Smokeless tobacco: Never Used  Substance and Sexual Activity  . Alcohol use: No  . Drug use: No  . Sexual activity: Not on file  Lifestyle  . Physical activity:    Days per week: Not on file    Minutes per session: Not on file  . Stress:  Not on file  Relationships  . Social connections:    Talks on phone: Not on file    Gets together: Not on file    Attends religious service: Not on file    Active member of club or organization: Not on file    Attends meetings of clubs or organizations: Not on file    Relationship status: Not on file  . Intimate partner violence:    Fear of current or ex partner: Not on file    Emotionally abused: Not on file    Physically abused: Not on file    Forced sexual activity: Not on file  Other Topics Concern  . Not on file  Social History Narrative  . Not on file   Family History  Problem Relation Age of Onset  . Diabetes Mother   . Heart disease Father     Objective: Office vital signs reviewed. BP 138/64   Pulse 60   Temp (!) 97.2 F (36.2 C) (Oral)   Ht '5\' 4"'  (1.626 m)   Wt 157 lb (71.2 kg)   BMI 26.95 kg/m   Physical Examination:  General: Awake, alert, well nourished, No acute distress HEENT: Normal, sclera white, MMM Cardio: regular rate and rhythm, S1S2 heard, 3/6 SEM appreciated at bilateral sternal borders. Pulm: clear to auscultation bilaterally, no wheezes, rhonchi or rales; normal work of breathing on room air Extremities: warm, well perfused, No edema, cyanosis or clubbing; +2 pulses bilaterally MSK: normal gait and station  Assessment/ Plan: 85 y.o. female   1. Type 2 diabetes mellitus without complication, without long-term current use of insulin (HCC) Under control for age and other comorbidities.  She would like tighter control and will modify diet accordingly. - Bayer DCA Hb A1c Waived - metFORMIN (GLUCOPHAGE) 500 MG tablet; TAKE 1 TABLET BY MOUTH TWICE DAILY WITH A MEAL  Dispense: 180 tablet; Refill: 1  2. Hyperlipidemia associated with type 2 diabetes mellitus ([place]) May consider decreasing given LDL in 30s. - rosuvastatin (CRESTOR) 20 MG tablet; Take 1 tablet (20 mg total) by mouth at bedtime.  Dispense: 90 tablet; Refill: 1  3. Hypertension  associated with diabetes ([place]) Controlled with Cozaar 25 mg upon manual recheck.  4. Nonrheumatic aortic (valve) stenosis Asymptomatic.  I reviewed her echocardiogram with her from last year.  I also reviewed Dr Marthann Schiller note.  5. Excessive gas Recommend probiotic.   Orders Placed This Encounter  Procedures  . Bayer DCA Hb A1c Waived   Meds ordered this encounter  Medications  . metFORMIN (GLUCOPHAGE) 500 MG tablet    Sig: TAKE 1 TABLET BY MOUTH TWICE DAILY WITH A MEAL    Dispense:  180 tablet    Refill:  1  . rosuvastatin (CRESTOR) 20 MG tablet    Sig: Take 1  tablet (20 mg total) by mouth at bedtime.    Dispense:  90 tablet    Refill:  1  . losartan (COZAAR) 25 MG tablet    Sig: Take 1 tablet (25 mg total) by mouth daily.    Dispense:  90 tablet    Refill:  [place], [place] [phone]

## 2018-02-22 NOTE — Patient Instructions (Addendum)
Continue your medications.  Your sugar was under good control today. We can see eachother again this summer for recheck, sooner if you need me.  Have the eye doctor in [place] send me a copy of your results.   Aortic Valve Stenosis  Aortic valve stenosis is a narrowing of the aortic valve in the heart. The aortic valve opens and closes to regulate blood flow between the left side of the heart (left ventricle) and the artery that leads away from the heart (aorta). When the aortic valve becomes narrow, it is difficult for the heart to pump blood out to the body, which causes the heart to work harder. The extra work can weaken the heart muscle over time. Aortic valve stenosis can range from mild to severe. If it is not treated, it can become more severe over time and lead to heart failure. What are the causes? This condition may be caused by:  Buildup of calcium around and on the aortic valve. This can occur with aging. This is the most common cause of aortic valve stenosis.  A heart problem that developed in the womb (birth defect).  Rheumatic fever.  Radiation to the chest. What increases the risk? You may be more likely to develop this condition if:  You are older than age 67.  You were born with an abnormal bicuspid valve. What are the signs or symptoms? You may not have any symptoms until your condition becomes severe. It may take 10-20 years for mild or moderate aortic valve stenosis to become severe. Symptoms may include:  Shortness of breath. This may get worse during physical activity.  Feeling unusually weak and tired (fatigue).  Extreme discomfort in the chest, neck, or arm during physical activity (angina).  A heartbeat that is irregular or faster than normal (palpitations).  Dizziness or fainting. This may happen when you get physically tired or after you take certain heart medicines, such as nitroglycerin. How is this diagnosed? This condition may be diagnosed  with:  A physical exam.  Echocardiogram. This is a type of imaging test that uses sound waves (ultrasound) to make images of your heart. There are two kinds of this test that may be used. ? Transthoracic echocardiogram (TTE). For this type, a wand-like tool (transducer) is moved over your chest to create ultrasound images that are recorded by a computer. ? Transesophageal echocardiogram (TEE). For this type, a flexible tube (probe) is inserted down the part of the body that moves food from your mouth to your stomach (esophagus). The heart and the esophagus are close to each other. Your health care provider will use the probe to take clear, detailed pictures of the heart.  Cardiac catheterization. For this procedure, a small, thin tube (catheter) is passed through a large vein in your neck, groin, or arm. The catheter is used to get information about arteries, structures, blood pressure, and oxygen levels in your heart.  Stress tests. These are tests that evaluate the blood supply to your heart and your heart's response to exercise. You may work with a health care provider who specializes in the heart (cardiologist) for diagnosis and treatment. How is this treated? Treatment depends on how severe your condition is and what your symptoms are. You will need to have your heart checked regularly to make sure that your condition is not getting worse or causing serious problems. Treatment may also include:  Surgery to replace your aortic valve. This is the most common treatment for aortic valve stenosis, and  it is the only treatment to cure the condition. Several types of surgeries are available. The surgery may be done: ? Through a large incision over your heart (open-heart surgery). ? Through small incisions, using a flexible tube called a catheter (transcatheter aortic valve replacement, TAVR).  Medicines that help to keep your heart rate regular.  Medicines that thin your blood (anticoagulants) to  prevent blood clots.  Antibiotic medicines to help prevent infection. If your condition is mild, you may only need regular follow-up visits for monitoring. Follow these instructions at home: Lifestyle  Limit alcohol intake to no more than 1 drink a day for nonpregnant women and 2 drinks a day for men. One drink equals 12 oz of beer, 5 oz of wine, or 1 oz of hard liquor.  Do not use any products that contain nicotine or tobacco, such as cigarettes and e-cigarettes. If you need help quitting, ask your health care provider.  Work with your health care provider to manage your blood pressure and cholesterol.  Maintain a healthy weight. Eating and drinking   Eat a heart-healthy diet that includes plenty of fresh fruits and vegetables, whole grains, lean protein, and low-fat or nonfat dairy.  Limit how much caffeine you drink. Caffeine can affect your heart's rate and rhythm.  Avoid foods that are: ? High in salt (sodium), saturated fat, or sugar. ? Canned or highly processed. ? Fried.  Follow instructions from your health care provider about any other eating or drinking restrictions. Activity  Exercise regularly and return to your normal activities as told by your health care provider. Ask your health care provider what amount and type of physical activity is safe for you. ? If your aortic valve stenosis is mild, you may only need to avoid very intense physical activity, such as heavy weight lifting. ? The more severe your aortic valve stenosis is, the more activities you may need to avoid. If you are taking blood thinners:  Before you take any medicines that contain aspirin or NSAIDs, talk with your health care provider. These medicines increase your risk for dangerous bleeding.  Take your medicine exactly as told, at the same time every day.  Avoid activities that could cause injury or bruising, and follow instructions about how to prevent falls.  Wear a medical alert bracelet or  carry a card that lists what medicines you take. General instructions  Take over-the-counter and prescription medicines only as told by your health care provider.  If you were prescribed an antibiotic, take it as told by your health care provider. Do not stop taking the antibiotic even if you start to feel better.  If you are a woman and you plan to become pregnant, talk with your health care provider before you become pregnant.  Before you have any type of medical or dental procedure or surgery, tell all health care providers that you have aortic valve stenosis. This may affect the treatment that you receive.  Keep all follow-up visits as told by your health care provider. This is important. Contact a health care provider if:  You have a fever. Get help right away if:  You develop any of the following symptoms: ? Chest pain. ? Chest tightness. ? Shortness of breath. ? Trouble breathing.  You feel light-headed.  You feel like you might faint.  Your heartbeat is irregular or faster than normal. These symptoms may represent a serious problem that is an emergency. Do not wait to see if the symptoms will go away.  Get medical help right away. Call your local emergency services (911 in the [place] Do not drive yourself to the hospital. Summary  Aortic valve stenosis is a narrowing of the aortic valve in the heart. The aortic valve opens and closes to regulate blood flow between the left side of the heart (left ventricle) and the artery that leads away from the heart (aorta).  Aortic valve stenosis can range from mild to severe. If it is not treated, it can become more severe over time and lead to heart failure.  Treatment depends on how severe your condition is and what your symptoms are. You will need to have your heart checked regularly to make sure that your condition is not getting worse or causing serious problems.  Exercise regularly and return to your normal activities as told by  your health care provider. Ask your health care provider what amount and type of physical activity is safe for you. This information is not intended to replace advice given to you by your health care provider. Make sure you discuss any questions you have with your health care provider. Document Released: 10/08/2002 Document Revised: 10/12/2016 Document Reviewed: 10/12/2016 Elsevier Interactive Patient Education  2019 [employer].

## 2018-02-26 LAB — BAYER DCA HB A1C WAIVED: HB A1C: 7.3 % — AB (ref <7.0)

## 2018-04-16 ENCOUNTER — Encounter: Payer: Self-pay | Admitting: *Deleted

## 2018-05-16 ENCOUNTER — Ambulatory Visit: Payer: Medicare Other | Admitting: Podiatry

## 2018-05-29 ENCOUNTER — Ambulatory Visit: Payer: Medicare Other | Admitting: Podiatry

## 2018-06-04 DIAGNOSIS — M79671 Pain in right foot: Secondary | ICD-10-CM | POA: Diagnosis not present

## 2018-06-04 DIAGNOSIS — I739 Peripheral vascular disease, unspecified: Secondary | ICD-10-CM | POA: Diagnosis not present

## 2018-06-04 DIAGNOSIS — E1140 Type 2 diabetes mellitus with diabetic neuropathy, unspecified: Secondary | ICD-10-CM | POA: Diagnosis not present

## 2018-06-04 DIAGNOSIS — M79672 Pain in left foot: Secondary | ICD-10-CM | POA: Diagnosis not present

## 2018-06-04 DIAGNOSIS — L110 Acquired keratosis follicularis: Secondary | ICD-10-CM | POA: Diagnosis not present

## 2018-06-26 ENCOUNTER — Telehealth: Payer: Self-pay | Admitting: Family Medicine

## 2018-08-20 DIAGNOSIS — L110 Acquired keratosis follicularis: Secondary | ICD-10-CM | POA: Diagnosis not present

## 2018-08-20 DIAGNOSIS — M79671 Pain in right foot: Secondary | ICD-10-CM | POA: Diagnosis not present

## 2018-08-20 DIAGNOSIS — E1140 Type 2 diabetes mellitus with diabetic neuropathy, unspecified: Secondary | ICD-10-CM | POA: Diagnosis not present

## 2018-08-20 DIAGNOSIS — M79672 Pain in left foot: Secondary | ICD-10-CM | POA: Diagnosis not present

## 2018-08-20 DIAGNOSIS — I739 Peripheral vascular disease, unspecified: Secondary | ICD-10-CM | POA: Diagnosis not present

## 2018-08-23 ENCOUNTER — Ambulatory Visit: Payer: Medicare Other | Admitting: Family Medicine

## 2018-08-26 ENCOUNTER — Ambulatory Visit: Payer: Medicare Other | Admitting: Family Medicine

## 2018-09-13 ENCOUNTER — Ambulatory Visit: Payer: Medicare Other | Admitting: Family Medicine

## 2018-09-13 ENCOUNTER — Other Ambulatory Visit: Payer: Self-pay

## 2018-09-16 ENCOUNTER — Ambulatory Visit (INDEPENDENT_AMBULATORY_CARE_PROVIDER_SITE_OTHER): Payer: Medicare Other | Admitting: Family Medicine

## 2018-09-16 ENCOUNTER — Other Ambulatory Visit: Payer: Self-pay

## 2018-09-16 ENCOUNTER — Encounter: Payer: Self-pay | Admitting: Family Medicine

## 2018-09-16 VITALS — BP 133/69 | HR 65 | Temp 98.6°F | Ht 63.0 in | Wt 147.0 lb

## 2018-09-16 DIAGNOSIS — I6521 Occlusion and stenosis of right carotid artery: Secondary | ICD-10-CM | POA: Diagnosis not present

## 2018-09-16 DIAGNOSIS — E1169 Type 2 diabetes mellitus with other specified complication: Secondary | ICD-10-CM

## 2018-09-16 DIAGNOSIS — E785 Hyperlipidemia, unspecified: Secondary | ICD-10-CM

## 2018-09-16 DIAGNOSIS — R221 Localized swelling, mass and lump, neck: Secondary | ICD-10-CM

## 2018-09-16 DIAGNOSIS — I350 Nonrheumatic aortic (valve) stenosis: Secondary | ICD-10-CM | POA: Diagnosis not present

## 2018-09-16 DIAGNOSIS — I10 Essential (primary) hypertension: Secondary | ICD-10-CM | POA: Diagnosis not present

## 2018-09-16 DIAGNOSIS — E119 Type 2 diabetes mellitus without complications: Secondary | ICD-10-CM

## 2018-09-16 DIAGNOSIS — R82998 Other abnormal findings in urine: Secondary | ICD-10-CM

## 2018-09-16 DIAGNOSIS — E1159 Type 2 diabetes mellitus with other circulatory complications: Secondary | ICD-10-CM

## 2018-09-16 LAB — BAYER DCA HB A1C WAIVED: HB A1C (BAYER DCA - WAIVED): 7.1 % — ABNORMAL HIGH (ref <7.0)

## 2018-09-16 MED ORDER — LOSARTAN POTASSIUM 25 MG PO TABS: 25.0000 mg | ORAL_TABLET | Freq: Every day | ORAL | 1 refills | Status: DC

## 2018-09-16 MED ORDER — METFORMIN HCL 500 MG PO TABS: ORAL_TABLET | ORAL | 1 refills | Status: DC

## 2018-09-16 MED ORDER — ROSUVASTATIN CALCIUM 20 MG PO TABS: 20.0000 mg | ORAL_TABLET | Freq: Every day | ORAL | 1 refills | Status: DC

## 2018-09-16 NOTE — Patient Instructions (Signed)

## 2018-09-16 NOTE — Progress Notes (Signed)
Subjective: CC: DM with hypertension hyperlipidemia PCP: Janora Norlander, DO JKK:[id number] E Glowacki is a 85 y.o. female presenting to clinic today for:  1. Type 2 Diabetes with hypertension and hyperlipidemia:  Diagnosed about 13 years ago.  BG this am was 130.  High 138.  Low 106.  No hypoglycemic episodes.  She reports compliance with metformin, losartan and Crestor.  She has noticed some frothy urine over the last several weeks which concerned her.  Denies any dysuria, hematuria.  She is hydrating adequately.  Last eye exam: due Last foot exam: 11/21/2017 Last A1c:  Lab Results  Component Value Date   HGBA1C 7.3 (H) 02/22/2018   Nephropathy screen indicated?: on ARB Last flu, zoster and/or pneumovax: UTD Immunization History  Administered Date(s) Administered  . Influenza, High Dose Seasonal PF 11/21/2017  . Influenza,inj,Quad PF,6+ Mos 10/20/2016  . Pneumococcal Conjugate-13 02/22/2018  . Zoster 08/27/2013    ROS: Denies dizziness, falls, chest pain, shortness of breath.  2.  Neck knot Patient does feel that the lump on the left side of her neck is getting larger.  Denies any pain or change in swallowing.  No Known Allergies Past Medical History:  Diagnosis Date  . Diabetes mellitus without complication ([place])   . Hyperlipidemia   . Hypertension   . Psoriasis     Current Outpatient Medications:  .  aspirin 81 MG tablet, Take 81 mg by mouth daily., Disp: , Rfl:  .  Blood Glucose Monitoring Suppl (BLOOD GLUCOSE MONITOR SYSTEM) w/Device KIT, 1 Device by Does not apply route 2 (two) times daily., Disp: 1 each, Rfl: 0 .  Cholecalciferol (VITAMIN D3 SUPER STRENGTH) 2000 units TABS, Take 1 tablet by mouth daily., Disp: , Rfl:  .  glucose blood test strip, Check blood glucose twice daily, Disp: 100 each, Rfl: 12 .  Lancets MISC, Twice daily, Disp: 100 each, Rfl: 11 .  losartan (COZAAR) 25 MG tablet, Take 1 tablet (25 mg total) by mouth daily., Disp: 90 tablet, Rfl: 1  .  metFORMIN (GLUCOPHAGE) 500 MG tablet, TAKE 1 TABLET BY MOUTH TWICE DAILY WITH A MEAL, Disp: 180 tablet, Rfl: 1 .  multivitamin-lutein (OCUVITE-LUTEIN) CAPS capsule, Take 1 capsule by mouth daily., Disp: , Rfl:  .  rosuvastatin (CRESTOR) 20 MG tablet, Take 1 tablet (20 mg total) by mouth at bedtime., Disp: 90 tablet, Rfl: 1 Social History   Socioeconomic History  . Marital status: Widowed    Spouse name: Not on file  . Number of children: Not on file  . Years of education: Not on file  . Highest education level: Not on file  Occupational History  . Not on file  Social Needs  . Financial resource strain: Not on file  . Food insecurity    Worry: Not on file    Inability: Not on file  . Transportation needs    Medical: Not on file    Non-medical: Not on file  Tobacco Use  . Smoking status: Former Research scientist (life sciences)  . Smokeless tobacco: Never Used  Substance and Sexual Activity  . Alcohol use: No  . Drug use: No  . Sexual activity: Not on file  Lifestyle  . Physical activity    Days per week: Not on file    Minutes per session: Not on file  . Stress: Not on file  Relationships  . Social Herbalist on phone: Not on file    Gets together: Not on file    Attends religious  service: Not on file    Active member of club or organization: Not on file    Attends meetings of clubs or organizations: Not on file    Relationship status: Not on file  . Intimate partner violence    Fear of current or ex partner: Not on file    Emotionally abused: Not on file    Physically abused: Not on file    Forced sexual activity: Not on file  Other Topics Concern  . Not on file  Social History Narrative  . Not on file   Family History  Problem Relation Age of Onset  . Diabetes Mother   . Heart disease Father     Objective: Office vital signs reviewed. BP 133/69   Pulse 65   Temp 98.6 F (37 C) (Temporal)   Ht '5\' 3"'  (1.6 m)   Wt 147 lb (66.7 kg)   BMI 26.04 kg/m   Physical  Examination:  General: Awake, alert, well nourished, No acute distress HEENT: Normal, sclera white, MMM; she has a nontender prominent bulge at the base of the neck on the left somewhat over the left SCM joint; she has murmur within both carotids. Cardio: regular rate and rhythm, S1S2 heard, 3/6 SEM appreciated at bilateral sternal borders. Pulm: clear to auscultation bilaterally, no wheezes, rhonchi or rales; normal work of breathing on room air Extremities: warm, well perfused, No edema, cyanosis or clubbing; +2 pulses bilaterally MSK: normal gait and station  Assessment/ Plan: 85 y.o. female   1. Type 2 diabetes mellitus without complication, without long-term current use of insulin (HCC) Controlled for age and better than last visit.  A1c today was down to 7.1.  Continue metformin twice daily.  Check urine microalbumin given reports of frothy urine. - Bayer DCA Hb A1c Waived - Microalbumin / creatinine urine ratio - metFORMIN (GLUCOPHAGE) 500 MG tablet; TAKE 1 TABLET BY MOUTH TWICE DAILY WITH A MEAL  Dispense: 180 tablet; Refill: 1  2. Hyperlipidemia associated with type 2 diabetes mellitus ([place]) Check lipid panel, liver function tests.  Continue statin - CMP14+EGFR - Lipid Panel - rosuvastatin (CRESTOR) 20 MG tablet; Take 1 tablet (20 mg total) by mouth at bedtime.  Dispense: 90 tablet; Refill: 1  3. Hypertension associated with diabetes ([place]) Controlled.  Continue current regimen - CMP14+EGFR - losartan (COZAAR) 25 MG tablet; Take 1 tablet (25 mg total) by mouth daily.  Dispense: 90 tablet; Refill: 1  4. Nonrheumatic aortic (valve) stenosis Followed by cardiology.  5. Carotid artery stenosis, asymptomatic, right We will obtain carotid Dopplers to follow-up on her right carotid artery stenosis. - US Carotid Duplex Bilateral; Future  6. Frothy urine Possibly related to proteinuria.  Check urine microalbumin - Microalbumin / creatinine urine ratio  7. Neck mass Possibly a  cyst over the SCM.  She feels that this is getting larger.  Will obtain soft tissue head and neck to further evaluate - US Soft Tissue Head/Neck; Future    Orders Placed This Encounter  Procedures  . Bayer DCA Hb A1c Waived  . CMP14+EGFR  . Lipid Panel  . Microalbumin / creatinine urine ratio   No orders of the defined types were placed in this encounter.    Janora Norlander, DO [place] [phone]

## 2018-09-17 ENCOUNTER — Telehealth: Payer: Self-pay | Admitting: Family Medicine

## 2018-09-17 LAB — SPECIMEN STATUS

## 2018-09-17 LAB — CMP14+EGFR
ALT: 11 IU/L (ref 0–32)
AST: 15 IU/L (ref 0–40)
Albumin/Globulin Ratio: 1.9 (ref 1.2–2.2)
Albumin: 4.1 g/dL (ref 3.6–4.6)
Alkaline Phosphatase: 70 IU/L (ref 39–117)
BUN/Creatinine Ratio: 14 (ref 12–28)
BUN: 12 mg/dL (ref 8–27)
Bilirubin Total: 1.1 mg/dL (ref 0.0–1.2)
CO2: 24 mmol/L (ref 20–29)
Calcium: 8.9 mg/dL (ref 8.7–10.3)
Chloride: 104 mmol/L (ref 96–106)
Creatinine, Ser: 0.85 mg/dL (ref 0.57–1.00)
GFR calc Af Amer: 72 mL/min/{1.73_m2} (ref >59)
GFR calc non Af Amer: 62 mL/min/{1.73_m2} (ref >59)
Globulin, Total: 2.2 g/dL (ref 1.5–4.5)
Glucose: 126 mg/dL — ABNORMAL HIGH (ref 65–99)
Potassium: 3.9 mmol/L (ref 3.5–5.2)
Sodium: 145 mmol/L — ABNORMAL HIGH (ref 134–144)
Total Protein: 6.3 g/dL (ref 6.0–8.5)

## 2018-09-17 LAB — LIPID PANEL
Chol/HDL Ratio: 3.6 ratio (ref 0.0–4.4)
Cholesterol, Total: 139 mg/dL (ref 100–199)
HDL: 39 mg/dL — ABNORMAL LOW (ref >39)
LDL Calculated: 77 mg/dL (ref 0–99)
Triglycerides: 117 mg/dL (ref 0–149)
VLDL Cholesterol Cal: 23 mg/dL (ref 5–40)

## 2018-09-17 LAB — MICROALBUMIN / CREATININE URINE RATIO
Creatinine, Urine: 114 mg/dL
Microalb/Creat Ratio: 56 mg/g creat — ABNORMAL HIGH (ref 0–29)
Microalbumin, Urine: 64.3 ug/mL

## 2018-09-17 NOTE — Telephone Encounter (Signed)
Results given.

## 2018-09-23 ENCOUNTER — Telehealth: Payer: Self-pay | Admitting: Family Medicine

## 2018-09-24 ENCOUNTER — Ambulatory Visit (HOSPITAL_COMMUNITY)
Admission: RE | Admit: 2018-09-24 | Discharge: 2018-09-24 | Disposition: A | Discharge status: Home / Self Care | Payer: Medicare Other | Source: Ambulatory Visit | Attending: Family Medicine | Admitting: Family Medicine

## 2018-09-24 ENCOUNTER — Other Ambulatory Visit: Payer: Self-pay

## 2018-09-24 DIAGNOSIS — I6521 Occlusion and stenosis of right carotid artery: Secondary | ICD-10-CM | POA: Insufficient documentation

## 2018-09-24 DIAGNOSIS — R221 Localized swelling, mass and lump, neck: Secondary | ICD-10-CM

## 2018-09-24 DIAGNOSIS — I6523 Occlusion and stenosis of bilateral carotid arteries: Secondary | ICD-10-CM | POA: Diagnosis not present

## 2018-09-26 ENCOUNTER — Encounter: Payer: Medicare Other | Admitting: *Deleted

## 2018-10-28 ENCOUNTER — Encounter: Payer: Self-pay | Admitting: Cardiovascular Disease

## 2018-10-28 ENCOUNTER — Ambulatory Visit (INDEPENDENT_AMBULATORY_CARE_PROVIDER_SITE_OTHER): Payer: Medicare Other | Admitting: Cardiovascular Disease

## 2018-10-28 ENCOUNTER — Other Ambulatory Visit: Payer: Self-pay

## 2018-10-28 VITALS — BP 170/68 | HR 70 | Ht 64.0 in | Wt 155.0 lb

## 2018-10-28 DIAGNOSIS — I6521 Occlusion and stenosis of right carotid artery: Secondary | ICD-10-CM

## 2018-10-28 DIAGNOSIS — I350 Nonrheumatic aortic (valve) stenosis: Secondary | ICD-10-CM | POA: Diagnosis not present

## 2018-10-28 DIAGNOSIS — I10 Essential (primary) hypertension: Secondary | ICD-10-CM

## 2018-10-28 DIAGNOSIS — E785 Hyperlipidemia, unspecified: Secondary | ICD-10-CM

## 2018-10-28 DIAGNOSIS — I6523 Occlusion and stenosis of bilateral carotid arteries: Secondary | ICD-10-CM | POA: Diagnosis not present

## 2018-10-28 NOTE — Patient Instructions (Signed)

## 2018-10-28 NOTE — Progress Notes (Signed)
SUBJECTIVE: The patient presents for follow-up of aortic stenosis. Past medical history includes left carotid stent, hypertension, hyperlipidemia, and type 2 diabetes mellitus. She underwent left internal carotid artery stenting at [facility] in 2007.  I reviewed an echocardiogram dated 03/05/2017 which showed normal left ventricular systolic function and regional wall motion, LVEF 60 to 65%, moderate to severe LVH, grade 1 diastolic dysfunction, moderately thickened aortic valve leaflets with mild stenosis, calculated valve area 1.62 cm, mild to moderate mitral annular calcification with mildly thickened leaflets and mild mitral regurgitation, moderate left atrial dilatation, mild right atrial dilatation, and mild right ventricular enlargement.  ECG performed today which I personally reviewed demonstrates sinus rhythm with right bundle branch block, left anterior fascicular block, and PVC.  The patient denies any symptoms of chest pain, palpitations, shortness of breath, lightheadedness, dizziness, leg swelling, orthopnea, PND, and syncope.  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  She thinks her blood pressure is elevated because she was anxious about today's visit.   Social history: She is originally from [place], [place].  She moved to [place] over 20 years ago.  She has 6 adult children.  2 live in [place], one in the state of [place], and one in [place].  She has a strong faith.  Review of Systems: As per "subjective", otherwise negative.  No Known Allergies  Current Outpatient Medications  Medication Sig Dispense Refill  . aspirin 81 MG tablet Take 81 mg by mouth daily.    . Blood Glucose Monitoring Suppl (BLOOD GLUCOSE MONITOR SYSTEM) w/Device KIT 1 Device by Does not apply route 2 (two) times daily. 1 each 0  . Cholecalciferol (VITAMIN D3 SUPER STRENGTH) 2000 units TABS Take 1 tablet by mouth daily.    Marland Kitchen glucose blood test strip Check blood glucose  twice daily 100 each 12  . Lancets MISC Twice daily 100 each 11  . losartan (COZAAR) 25 MG tablet Take 1 tablet (25 mg total) by mouth daily. 90 tablet 1  . metFORMIN (GLUCOPHAGE) 500 MG tablet TAKE 1 TABLET BY MOUTH TWICE DAILY WITH A MEAL 180 tablet 1  . multivitamin-lutein (OCUVITE-LUTEIN) CAPS capsule Take 1 capsule by mouth daily.    . rosuvastatin (CRESTOR) 20 MG tablet Take 1 tablet (20 mg total) by mouth at bedtime. 90 tablet 1   No current facility-administered medications for this visit.     Past Medical History:  Diagnosis Date  . Diabetes mellitus without complication ([place])   . Hyperlipidemia   . Hypertension   . Psoriasis     Past Surgical History:  Procedure Laterality Date  . ABDOMINAL HYSTERECTOMY    . APPENDECTOMY    . BLADDER REPAIR    . CAROTID STENT INSERTION Left   . CHOLECYSTECTOMY      Social History   Socioeconomic History  . Marital status: Widowed    Spouse name: Not on file  . Number of children: Not on file  . Years of education: Not on file  . Highest education level: Not on file  Occupational History  . Not on file  Social Needs  . Financial resource strain: Not on file  . Food insecurity    Worry: Not on file    Inability: Not on file  . Transportation needs    Medical: Not on file    Non-medical: Not on file  Tobacco Use  . Smoking status: Former Research scientist (life sciences)  . Smokeless tobacco: Never Used  Substance and  Sexual Activity  . Alcohol use: No  . Drug use: No  . Sexual activity: Not on file  Lifestyle  . Physical activity    Days per week: Not on file    Minutes per session: Not on file  . Stress: Not on file  Relationships  . Social Herbalist on phone: Not on file    Gets together: Not on file    Attends religious service: Not on file    Active member of club or organization: Not on file    Attends meetings of clubs or organizations: Not on file    Relationship status: Not on file  . Intimate partner violence    Fear  of current or ex partner: Not on file    Emotionally abused: Not on file    Physically abused: Not on file    Forced sexual activity: Not on file  Other Topics Concern  . Not on file  Social History Narrative  . Not on file     Vitals:   10/28/18 1430  BP: (!) 170/68  Pulse: 70  SpO2: 96%  Weight: 155 lb (70.3 kg)  Height: '5\' 4"'  (1.626 m)    Wt Readings from Last 3 Encounters:  10/28/18 155 lb (70.3 kg)  09/16/18 147 lb (66.7 kg)  02/22/18 157 lb (71.2 kg)     PHYSICAL EXAM General: NAD HEENT: Normal. Neck: No JVD, no thyromegaly. Lungs: Clear to auscultation bilaterally with normal respiratory effort. CV: Regular rate and rhythm, normal S1/S2, no S3/S4, 2/6 ejection systolic murmur over right upper sternal border.  No peripheral edema.  Soft left carotid bruit and louder right carotid bruit.    Abdomen: Soft, nontender, no distention.  Neurologic: Alert and oriented.  Psych: Normal affect. Skin: Normal. Musculoskeletal: No gross deformities.      Labs: Lab Results  Component Value Date/Time   K 3.9 09/16/2018 10:16 AM   BUN 12 09/16/2018 10:16 AM   CREATININE 0.85 09/16/2018 10:16 AM   ALT 11 09/16/2018 10:16 AM   TSH 2.270 02/23/2017 10:16 AM   HGB WILL FOLLOW 09/16/2018 10:16 AM     Lipids: Lab Results  Component Value Date/Time   LDLCALC 77 09/16/2018 10:16 AM   CHOL 139 09/16/2018 10:16 AM   TRIG 117 09/16/2018 10:16 AM   HDL 39 (L) 09/16/2018 10:16 AM       ASSESSMENT AND PLAN: 1.  Aortic stenosis: Mild in severity by echocardiogram in February 2019.  I will obtain a follow-up echocardiogram.  2.  Bilateral internal carotid artery stenosis: Carotid Dopplers on 09/25/2018 showed a large amount of right-sided atherosclerotic plaque, morphologically similar to the 02/2017 exam.  50 to 69% right-sided ICA stenosis.  No significant stenosis in the left internal carotid artery. Left internal carotid artery stent placed in 2007.  Dopplers should be  repeated in 1 year.  She is on aspirin and rosuvastatin.  3.  Hypertension: Blood pressure is markedly elevated.  She says she has whitecoat hypertension.  Blood pressure was normal at PCP office visit in late August.  This will need continued monitoring.  I asked the patient to monitor this herself as well.  4.  Hyperlipidemia: Lipids reviewed above.  Continue statin therapy.    Disposition: Follow up 6 months   Kate Sable, M.D., F.A.C.C.

## 2018-11-11 DIAGNOSIS — E1140 Type 2 diabetes mellitus with diabetic neuropathy, unspecified: Secondary | ICD-10-CM | POA: Diagnosis not present

## 2018-11-11 DIAGNOSIS — L110 Acquired keratosis follicularis: Secondary | ICD-10-CM | POA: Diagnosis not present

## 2018-11-11 DIAGNOSIS — M79672 Pain in left foot: Secondary | ICD-10-CM | POA: Diagnosis not present

## 2018-11-11 DIAGNOSIS — I739 Peripheral vascular disease, unspecified: Secondary | ICD-10-CM | POA: Diagnosis not present

## 2018-11-11 DIAGNOSIS — M79671 Pain in right foot: Secondary | ICD-10-CM | POA: Diagnosis not present

## 2018-11-27 ENCOUNTER — Other Ambulatory Visit: Payer: Self-pay

## 2018-11-27 ENCOUNTER — Ambulatory Visit (INDEPENDENT_AMBULATORY_CARE_PROVIDER_SITE_OTHER): Payer: Medicare Other

## 2018-11-27 DIAGNOSIS — I350 Nonrheumatic aortic (valve) stenosis: Secondary | ICD-10-CM

## 2018-12-11 ENCOUNTER — Telehealth: Payer: Self-pay | Admitting: *Deleted

## 2018-12-11 NOTE — Telephone Encounter (Signed)
Notes recorded by Laurine Blazer, LPN on 08/03/1973 at 2:23 PM EST  Patient notified. Copy to pmd.  ------   Notes recorded by Laurine Blazer, LPN on 88/32/5498 at 11:25 AM EST  Left message to return call.   ------   Notes recorded by Laurine Blazer, LPN on 26/41/5830 at 5:22 PM EST  Left message to return call.   ------   Notes recorded by Laurine Blazer, LPN on 94/07/6806 at 2:50 PM EST  No answer.   ------   Notes recorded by Arnoldo Lenis, MD on 12/02/2018 at 3:18 PM EST  Echo shows normal heart pumping function, the aortic stenosis (stiffness of the valve) remains mild    Zandra Abts MD

## 2018-12-11 NOTE — Telephone Encounter (Signed)
Heart-Healthy Eating Plan & DASH Eating Plan - mailed to patient at her request.

## 2019-03-18 ENCOUNTER — Other Ambulatory Visit: Payer: Self-pay

## 2019-03-19 ENCOUNTER — Encounter: Payer: Self-pay | Admitting: Family Medicine

## 2019-03-19 ENCOUNTER — Ambulatory Visit (INDEPENDENT_AMBULATORY_CARE_PROVIDER_SITE_OTHER): Payer: Medicare Other

## 2019-03-19 ENCOUNTER — Ambulatory Visit (INDEPENDENT_AMBULATORY_CARE_PROVIDER_SITE_OTHER): Payer: Medicare Other | Admitting: Family Medicine

## 2019-03-19 VITALS — BP 156/81 | HR 75 | Temp 98.6°F | Ht 64.0 in | Wt 149.0 lb

## 2019-03-19 DIAGNOSIS — E1159 Type 2 diabetes mellitus with other circulatory complications: Secondary | ICD-10-CM

## 2019-03-19 DIAGNOSIS — E119 Type 2 diabetes mellitus without complications: Secondary | ICD-10-CM

## 2019-03-19 DIAGNOSIS — M25552 Pain in left hip: Secondary | ICD-10-CM

## 2019-03-19 DIAGNOSIS — I10 Essential (primary) hypertension: Secondary | ICD-10-CM

## 2019-03-19 DIAGNOSIS — E1169 Type 2 diabetes mellitus with other specified complication: Secondary | ICD-10-CM

## 2019-03-19 DIAGNOSIS — E785 Hyperlipidemia, unspecified: Secondary | ICD-10-CM

## 2019-03-19 DIAGNOSIS — I152 Hypertension secondary to endocrine disorders: Secondary | ICD-10-CM

## 2019-03-19 LAB — BAYER DCA HB A1C WAIVED: HB A1C (BAYER DCA - WAIVED): 8.2 % — ABNORMAL HIGH (ref <7.0)

## 2019-03-19 MED ORDER — METFORMIN HCL 1000 MG PO TABS: 1000.0000 mg | ORAL_TABLET | Freq: Two times a day (BID) | ORAL | 1 refills | Status: DC

## 2019-03-19 NOTE — Progress Notes (Signed)
Subjective: CC: DM with hypertension hyperlipidemia PCP: Janora Norlander, DO ZRA:[id number] E Emily Sanders is a 86 y.o. female presenting to clinic today for:  1. Type 2 Diabetes with hypertension and hyperlipidemia:  Diagnosed about 13 years ago.  Patient reports her blood sugars have been relatively well controlled.  She is compliant with her Metformin, Crestor and losartan.  However, she does note she did not take any of her medicines this morning because she was trying to be fasting for today's labs.  Blood pressure typically well controlled at 130s over 80s at home.  Last eye exam: due Last foot exam: Due Last A1c:  Lab Results  Component Value Date   HGBA1C 7.1 (H) 09/16/2018   Nephropathy screen indicated?: on ARB Last flu, zoster and/or pneumovax: UTD Immunization History  Administered Date(s) Administered  . Influenza, High Dose Seasonal PF 11/21/2017  . Influenza,inj,Quad PF,6+ Mos 10/20/2016  . Pneumococcal Conjugate-13 02/22/2018  . Zoster 08/27/2013    ROS: Denies dizziness, falls, chest pain, shortness of breath.  2.  Left hip pain Patient reports about a 17-monthhistory of left-sided lateral hip pain that seems to be worse with lying down.  Denies any radiation of pain elsewhere including the groin.  No lower extremity weakness, sensation changes.  No preceding injury but notes that when she had an MRI a few years ago they noted evidence of a fracture of the left hip.  She was never aware that she had ever hurt herself and wanted to make sure that this is not something she should be worried about.  No Known Allergies Past Medical History:  Diagnosis Date  . Diabetes mellitus without complication ([place]   . Hyperlipidemia   . Hypertension   . Psoriasis     Current Outpatient Medications:  .  aspirin 81 MG tablet, Take 81 mg by mouth daily., Disp: , Rfl:  .  Blood Glucose Monitoring Suppl (BLOOD GLUCOSE MONITOR SYSTEM) w/Device KIT, 1 Device by Does not apply  route 2 (two) times daily., Disp: 1 each, Rfl: 0 .  Cholecalciferol (VITAMIN D3 SUPER STRENGTH) 2000 units TABS, Take 1 tablet by mouth daily., Disp: , Rfl:  .  glucose blood test strip, Check blood glucose twice daily, Disp: 100 each, Rfl: 12 .  Lancets MISC, Twice daily, Disp: 100 each, Rfl: 11 .  losartan (COZAAR) 25 MG tablet, Take 1 tablet (25 mg total) by mouth daily., Disp: 90 tablet, Rfl: 1 .  metFORMIN (GLUCOPHAGE) 500 MG tablet, TAKE 1 TABLET BY MOUTH TWICE DAILY WITH A MEAL, Disp: 180 tablet, Rfl: 1 .  rosuvastatin (CRESTOR) 20 MG tablet, Take 1 tablet (20 mg total) by mouth at bedtime., Disp: 90 tablet, Rfl: 1 .  multivitamin-lutein (OCUVITE-LUTEIN) CAPS capsule, Take 1 capsule by mouth daily., Disp: , Rfl:  Social History   Socioeconomic History  . Marital status: Widowed    Spouse name: Not on file  . Number of children: Not on file  . Years of education: Not on file  . Highest education level: Not on file  Occupational History  . Not on file  Tobacco Use  . Smoking status: Former SResearch scientist (life sciences) . Smokeless tobacco: Never Used  Substance and Sexual Activity  . Alcohol use: No  . Drug use: No  . Sexual activity: Not on file  Other Topics Concern  . Not on file  Social History Narrative  . Not on file   Social Determinants of Health   Financial Resource Strain:   .  Difficulty of Paying Living Expenses: Not on file  Food Insecurity:   . Worried About Charity fundraiser in the Last Year: Not on file  . Ran Out of Food in the Last Year: Not on file  Transportation Needs:   . Lack of Transportation (Medical): Not on file  . Lack of Transportation (Non-Medical): Not on file  Physical Activity:   . Days of Exercise per Week: Not on file  . Minutes of Exercise per Session: Not on file  Stress:   . Feeling of Stress : Not on file  Social Connections:   . Frequency of Communication with Friends and Family: Not on file  . Frequency of Social Gatherings with Friends and  Family: Not on file  . Attends Religious Services: Not on file  . Active Member of Clubs or Organizations: Not on file  . Attends Archivist Meetings: Not on file  . Marital Status: Not on file  Intimate Partner Violence:   . Fear of Current or Ex-Partner: Not on file  . Emotionally Abused: Not on file  . Physically Abused: Not on file  . Sexually Abused: Not on file   Family History  Problem Relation Age of Onset  . Diabetes Mother   . Heart disease Father     Objective: Office vital signs reviewed. BP (!) 156/81   Pulse 75   Temp 98.6 F (37 C) (Temporal)   Ht '5\' 4"'  (1.626 m)   Wt 149 lb (67.6 kg)   SpO2 97%   BMI 25.58 kg/m   Physical Examination:  General: Awake, alert, well nourished, No acute distress HEENT: Normal, sclera white, MMM Cardio: regular rate and rhythm, S1S2 heard, 2-3/6 SEM appreciated at bilateral sternal borders. Pulm: clear to auscultation bilaterally, no wheezes, rhonchi or rales; normal work of breathing on room air Extremities: warm, well perfused, No edema, cyanosis or clubbing; +2 pulses bilaterally MSK: normal gait and station; left hip with mild tenderness palpation over the area of the greater trochanter.  No appreciable swelling or bony deformities noted. Neuro: See DM foot exam below Diabetic Foot Exam - Simple   Simple Foot Form Diabetic Foot exam was performed with the following findings: Yes 03/19/2019  1:01 PM  Visual Inspection See comments: Yes Sensation Testing Intact to touch and monofilament testing bilaterally: Yes Pulse Check Posterior Tibialis and Dorsalis pulse intact bilaterally: Yes Comments She has onychomycotic changes to the nails with bilateral great toenails absent.  She sees a podiatrist and recently had her feet done.  No skin breakdown or preulcerative calluses noted.     DG Hip Unilat W OR W/O Pelvis 2-3 Views Left  Result Date: 03/19/2019 CLINICAL DATA:  Left hip pain without known injury. EXAM: DG  HIP (WITH OR WITHOUT PELVIS) 2-3V LEFT COMPARISON:  None. FINDINGS: There is no evidence of hip fracture or dislocation. There is no evidence of arthropathy or other focal bone abnormality. IMPRESSION: Negative. Electronically Signed   By: Marijo Conception M.D.   On: 03/19/2019 10:15    Assessment/ Plan: 86 y.o. female   1. Type 2 diabetes mellitus without complication, without long-term current use of insulin (HCC) Her A1c is a bit higher than her previous check.  She is gone up about a point.  Continue Metformin but I have increased her to 1000 mg twice daily.  I have asked her to keep an eye on her carbohydrates and reduce intake of this.  She was good understanding the plan follow-up  with me in 3 months rather than 6 given recent increase in sugar.  Continue statin.  Continue ARB. - Bayer DCA Hb A1c Waived - metFORMIN (GLUCOPHAGE) 1000 MG tablet; Take 1 tablet (1,000 mg total) by mouth 2 (two) times daily with a meal.  Dispense: 180 tablet; Refill: 1  2. Acute hip pain, left X-ray did not reveal any acute fractures.  Again, I do think that this is more likely to be a bursitis.  I recommend use of ice, oral NSAID if needed.  If ongoing we will plan for corticosteroid injection. - DG Hip Unilat W OR W/O Pelvis 2-3 Views Left; Future  3. Hyperlipidemia associated with type 2 diabetes mellitus ([place]) Continue statin  4. Hypertension associated with diabetes ([place]) Not controlled during today's visit but patient did not take her medication and was in little bit of discomfort with regards to her left hip.  No changes in meds   Orders Placed This Encounter  Procedures  . Bayer DCA Hb A1c Waived   No orders of the defined types were placed in this encounter.    Janora Norlander, DO [place] [phone]

## 2019-04-01 ENCOUNTER — Telehealth: Payer: Self-pay | Admitting: Family Medicine

## 2019-04-01 NOTE — Chronic Care Management (AMB) (Signed)
  Chronic Care Management   Outreach Note  04/01/2019 Name: Emily Sanders MRN: [id number] DOB: 1930-07-27  Emily Sanders is a 86 y.o. year old female who is a primary care patient of Janora Norlander, DO. I reached out to Revonda Humphrey by phone today in response to a referral sent by [name]'s health plan.     An unsuccessful telephone outreach was attempted today. The patient was referred to the case management team for assistance with care management and care coordination.   Follow Up Plan: A HIPPA compliant phone message was left for the patient providing contact information and requesting a return call.  The care management team will reach out to the patient again over the next 7 days.  If patient returns call to provider office, please advise to call [facility] at [place], [place], [place], [place] [address] Direct Dial: [phone] [email or web address] Website: [email or web address]

## 2019-04-07 NOTE — Chronic Care Management (AMB) (Signed)
  Chronic Care Management   Outreach Note  04/07/2019 Name: CLEVA CAMERO MRN: [id number] DOB: Dec 12, 1930  SEQUOYA HOGSETT is a 86 y.o. year old female who is a primary care patient of Janora Norlander, DO. I reached out to Revonda Humphrey by phone today in response to a referral sent by Ms. Emmit Pomfret [employer]'s health plan.     A second unsuccessful telephone outreach was attempted today. The patient was referred to the case management team for assistance with care management and care coordination.   Follow Up Plan: A HIPPA compliant phone message was left for the patient providing contact information and requesting a return call.  The care management team will reach out to the patient again over the next 7 days.  If patient returns call to provider office, please advise to call [facility] at [address], [address], [place], [place] [address] Direct Dial: [phone] [email or web address] Website: [email or web address]

## 2019-04-09 NOTE — Chronic Care Management (AMB) (Signed)
  Chronic Care Management   Note  04/09/2019 Name: Emily Sanders MRN: [id number] DOB: [birth year]  Emily Sanders is a 86 y.o. year old female who is a primary care patient of Janora Norlander, DO. I reached out to Revonda Humphrey by phone today in response to a referral sent by Ms. Emmit Pomfret [employer]'s health plan.     Ms. Lesniewski was given information about Chronic Care Management services today including:  1. CCM service includes personalized support from designated clinical staff supervised by her physician, including individualized plan of care and coordination with other care providers 2. 24/7 contact phone numbers for assistance for urgent and routine care needs. 3. Service will only be billed when office clinical staff spend 20 minutes or more in a month to coordinate care. 4. Only one practitioner may furnish and bill the service in a calendar month. 5. The patient may stop CCM services at any time (effective at the end of the month) by phone call to the office staff. 6. The patient will be responsible for cost sharing (co-pay) of up to 20% of the service fee (after annual deductible is met).  Patient did not agree to enrollment in care management services and does not wish to consider at this time.  Follow up plan: The patient has been provided with contact information for the care management team and has been advised to call with any health related questions or concerns.   Noreene Larsson, [place], [place], [place] [address] Direct Dial: [phone] [email or web address] Website: [email or web address]

## 2019-05-27 ENCOUNTER — Encounter: Payer: Self-pay | Admitting: Cardiovascular Disease

## 2019-05-27 ENCOUNTER — Other Ambulatory Visit: Payer: Self-pay

## 2019-05-27 ENCOUNTER — Ambulatory Visit (INDEPENDENT_AMBULATORY_CARE_PROVIDER_SITE_OTHER): Payer: Medicare Other | Admitting: Cardiovascular Disease

## 2019-05-27 VITALS — BP 162/84 | HR 72 | Temp 97.2°F | Ht 64.0 in | Wt 155.0 lb

## 2019-05-27 DIAGNOSIS — E785 Hyperlipidemia, unspecified: Secondary | ICD-10-CM | POA: Diagnosis not present

## 2019-05-27 DIAGNOSIS — I6523 Occlusion and stenosis of bilateral carotid arteries: Secondary | ICD-10-CM

## 2019-05-27 DIAGNOSIS — I10 Essential (primary) hypertension: Secondary | ICD-10-CM

## 2019-05-27 DIAGNOSIS — I350 Nonrheumatic aortic (valve) stenosis: Secondary | ICD-10-CM | POA: Diagnosis not present

## 2019-05-27 NOTE — Addendum Note (Signed)
Addended by: Barbarann Ehlers A on: 05/27/2019 08:38 AM   Modules accepted: Orders

## 2019-05-27 NOTE — Patient Instructions (Addendum)
Medication Instructions: Your physician recommends that you continue on your current medications as directed. Please refer to the Current Medication list given to you today.   Labwork: None today  Procedures/Testing: Your physician has requested that you have a carotid duplex.IN [place] This test is an ultrasound of the carotid arteries in your neck. It looks at blood flow through these arteries that supply the brain with blood. Allow one hour for this exam. There are no restrictions or special instructions.   Follow-Up: 1 year office visit with Dr.Koneswaran  Any Additional Special Instructions Will Be Listed Below (If Applicable).     If you need a refill on your cardiac medications before your next appointment, please call your pharmacy.       Thank you for choosing [facility] !

## 2019-05-27 NOTE — Progress Notes (Signed)
SUBJECTIVE: The patient presents for follow-up of aortic stenosis. Past medical history includes left carotid stent, hypertension, hyperlipidemia, and type 2 diabetes mellitus. She underwent left internal carotid artery stenting at [facility] in 2007.  The patient denies any symptoms of chest pain, palpitations, shortness of breath, lightheadedness, dizziness, leg swelling, orthopnea, PND, and syncope.  She has not taken her medications yet.  Echocardiogram on 11/27/2018 demonstrated mild aortic stenosis and vigorous LV systolic function, EF 65 to 70%.  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  She eats a healthy diet and avoids red meat and eats fish as well as vegetables and fruit.   Social history: She is originally from [place], [place].  She moved to [place] over 20 years ago.  She has 6 adult children.  2 live in [place], one in the state of [place], and one in [place].  She has a strong faith.  Review of Systems: As per "subjective", otherwise negative.  No Known Allergies  Current Outpatient Medications  Medication Sig Dispense Refill  . aspirin 81 MG tablet Take 81 mg by mouth daily.    . Blood Glucose Monitoring Suppl (BLOOD GLUCOSE MONITOR SYSTEM) w/Device KIT 1 Device by Does not apply route 2 (two) times daily. 1 each 0  . Cholecalciferol (VITAMIN D3 SUPER STRENGTH) 2000 units TABS Take 1 tablet by mouth daily.    Marland Kitchen glucose blood test strip Check blood glucose twice daily 100 each 12  . Lancets MISC Twice daily 100 each 11  . losartan (COZAAR) 25 MG tablet Take 1 tablet (25 mg total) by mouth daily. 90 tablet 1  . metFORMIN (GLUCOPHAGE) 1000 MG tablet Take 1 tablet (1,000 mg total) by mouth 2 (two) times daily with a meal. 180 tablet 1  . multivitamin-lutein (OCUVITE-LUTEIN) CAPS capsule Take 1 capsule by mouth daily.    . rosuvastatin (CRESTOR) 20 MG tablet Take 1 tablet (20 mg total) by mouth at bedtime. 90 tablet 1   No current  facility-administered medications for this visit.    Past Medical History:  Diagnosis Date  . Diabetes mellitus without complication ([place])   . Hyperlipidemia   . Hypertension   . Psoriasis     Past Surgical History:  Procedure Laterality Date  . ABDOMINAL HYSTERECTOMY    . APPENDECTOMY    . BLADDER REPAIR    . CAROTID STENT INSERTION Left   . CHOLECYSTECTOMY      Social History   Socioeconomic History  . Marital status: Widowed    Spouse name: Not on file  . Number of children: Not on file  . Years of education: Not on file  . Highest education level: Not on file  Occupational History  . Not on file  Tobacco Use  . Smoking status: Former Research scientist (life sciences)  . Smokeless tobacco: Never Used  Substance and Sexual Activity  . Alcohol use: No  . Drug use: No  . Sexual activity: Not on file  Other Topics Concern  . Not on file  Social History Narrative  . Not on file   Social Determinants of Health   Financial Resource Strain:   . Difficulty of Paying Living Expenses:   Food Insecurity:   . Worried About Charity fundraiser in the Last Year:   . Arboriculturist in the Last Year:   Transportation Needs:   . Film/video editor (Medical):   Marland Kitchen Lack of Transportation (Non-Medical):   Physical Activity:   .  Days of Exercise per Week:   . Minutes of Exercise per Session:   Stress:   . Feeling of Stress :   Social Connections:   . Frequency of Communication with Friends and Family:   . Frequency of Social Gatherings with Friends and Family:   . Attends Religious Services:   . Active Member of Clubs or Organizations:   . Attends Archivist Meetings:   Marland Kitchen Marital Status:   Intimate Partner Violence:   . Fear of Current or Ex-Partner:   . Emotionally Abused:   Marland Kitchen Physically Abused:   . Sexually Abused:    Barbarann Ehlers, RN was present throughout the entirety of the encounter.   Vitals:   05/27/19 0810  BP: (!) 162/84  Pulse: 72  Temp: (!) 97.2 F (36.2  C)  SpO2: 97%  Weight: 155 lb (70.3 kg)  Height: _0  (1.626 m)    Wt Readings from Last 3 Encounters:  05/27/19 155 lb (70.3 kg)  03/19/19 149 lb (67.6 kg)  10/28/18 155 lb (70.3 kg)     PHYSICAL EXAM General: NAD HEENT: Normal. Neck: No JVD, no thyromegaly. Lungs: Clear to auscultation bilaterally with normal respiratory effort. CV: Regular rate and rhythm, normal S1/S2, no S3/S4, 2/6 ejection systolic murmur over right upper sternal border.  No peripheral edema.   Abdomen: Soft, nontender, no distention.  Neurologic: Alert and oriented.  Psych: Normal affect. Skin: Normal. Musculoskeletal: No gross deformities.      Labs: Lab Results  Component Value Date/Time   K 3.9 09/16/2018 10:16 AM   BUN 12 09/16/2018 10:16 AM   CREATININE 0.85 09/16/2018 10:16 AM   ALT 11 09/16/2018 10:16 AM   TSH 2.270 02/23/2017 10:16 AM   HGB WILL FOLLOW 09/16/2018 10:16 AM     Lipids: Lab Results  Component Value Date/Time   LDLCALC 77 09/16/2018 10:16 AM   CHOL 139 09/16/2018 10:16 AM   TRIG 117 09/16/2018 10:16 AM   HDL 39 (L) 09/16/2018 10:16 AM      Echocardiogram 11/27/2018:  1. Left ventricular ejection fraction, by visual estimation, is 65 to  70%. The left ventricle has hyperdynamic function. There is moderately  increased left ventricular hypertrophy.  2. Left ventricular diastolic parameters are indeterminate.  3. Global right ventricle has normal systolic function.The right  ventricular size is normal. No increase in right ventricular wall  thickness.  4. Left atrial size was normal.  5. Right atrial size was normal.  6. Mild mitral annular calcification.  7. The mitral valve is abnormal. No evidence of mitral valve  regurgitation. No evidence of mitral stenosis.  8. The tricuspid valve is normal in structure. Tricuspid valve  regurgitation is not demonstrated.  9. The aortic valve is tricuspid. Aortic valve regurgitation is not  visualized. Mild  aortic valve stenosis.  10. The pulmonic valve was not well visualized. Pulmonic valve  regurgitation is not visualized.  11. The inferior vena cava is normal in size with greater than 50%  respiratory variability, suggesting right atrial pressure of 3 mmHg.   In comparison to the previous echocardiogram(s): Echocardiogram done  03/05/17 showed an EFof 65% with mild AS and an AV Peak Grad of 26 mmHg.    ASSESSMENT AND PLAN: 1.  Aortic stenosis: Mild in severity by echocardiogram in November 2020.  I will continue to monitor.  2.  Bilateral internal carotid artery stenosis: Carotid Dopplers on 09/25/2018 showed a large amount of right-sided atherosclerotic plaque, morphologically similar to the  02/2017 exam.  50 to 69% right-sided ICA stenosis.  No significant stenosis in the left internal carotid artery. Left internal carotid artery stent placed in 2007.  Dopplers should be repeated in September 2021.  She is on aspirin and rosuvastatin.  3.  Hypertension: Blood pressure is elevated but she has yet to take her medications this morning.    4.  Hyperlipidemia: Lipids reviewed above.  Continue statin therapy.    Disposition: Follow up 1 year   Kate Sable, M.D., F.A.C.C.

## 2019-06-03 IMAGING — DX DG CHEST 2V
2 series · 2 of 2 positions shown · non-contrast
Comparison: 07/05/2017

CLINICAL DATA: Crackling at bases, wheezing in RIGHT lower lobe,
diabetes mellitus, hypertension, former smoker

EXAM:
CHEST - 2 VIEW

[chest pa]
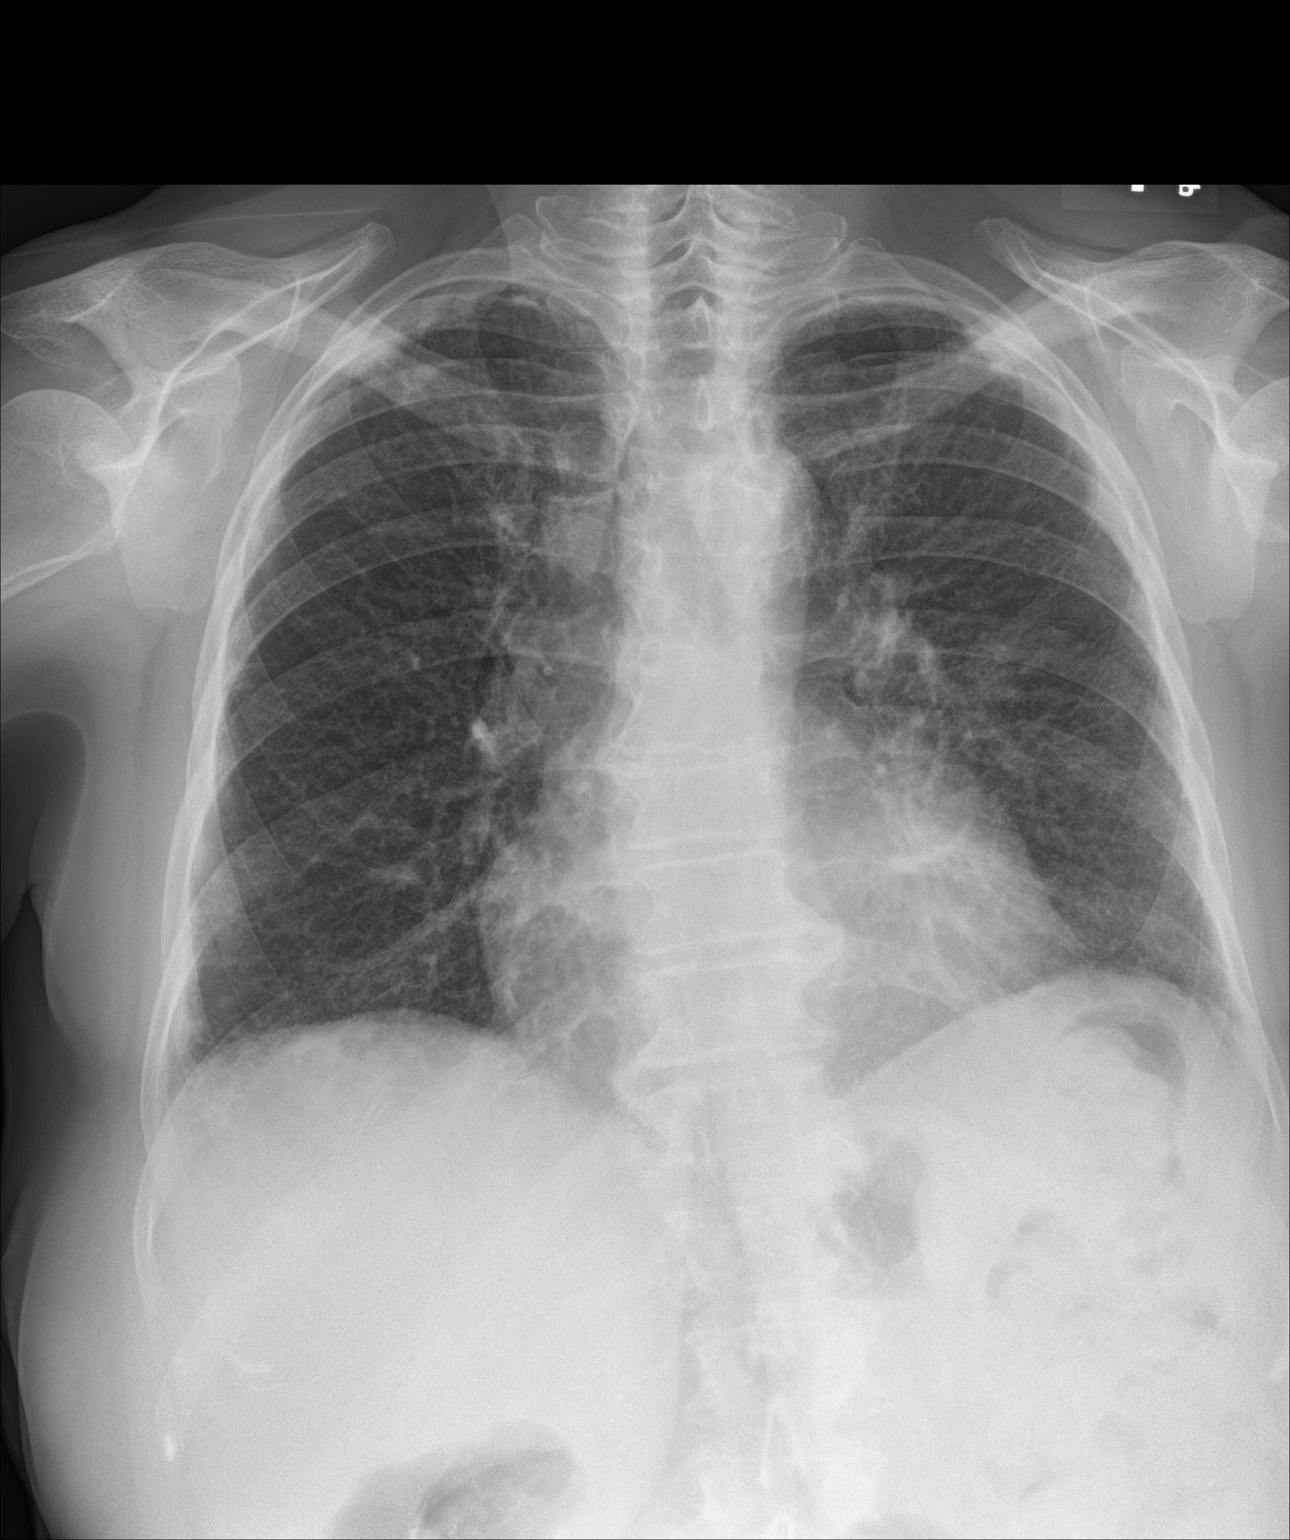

[chest lat]
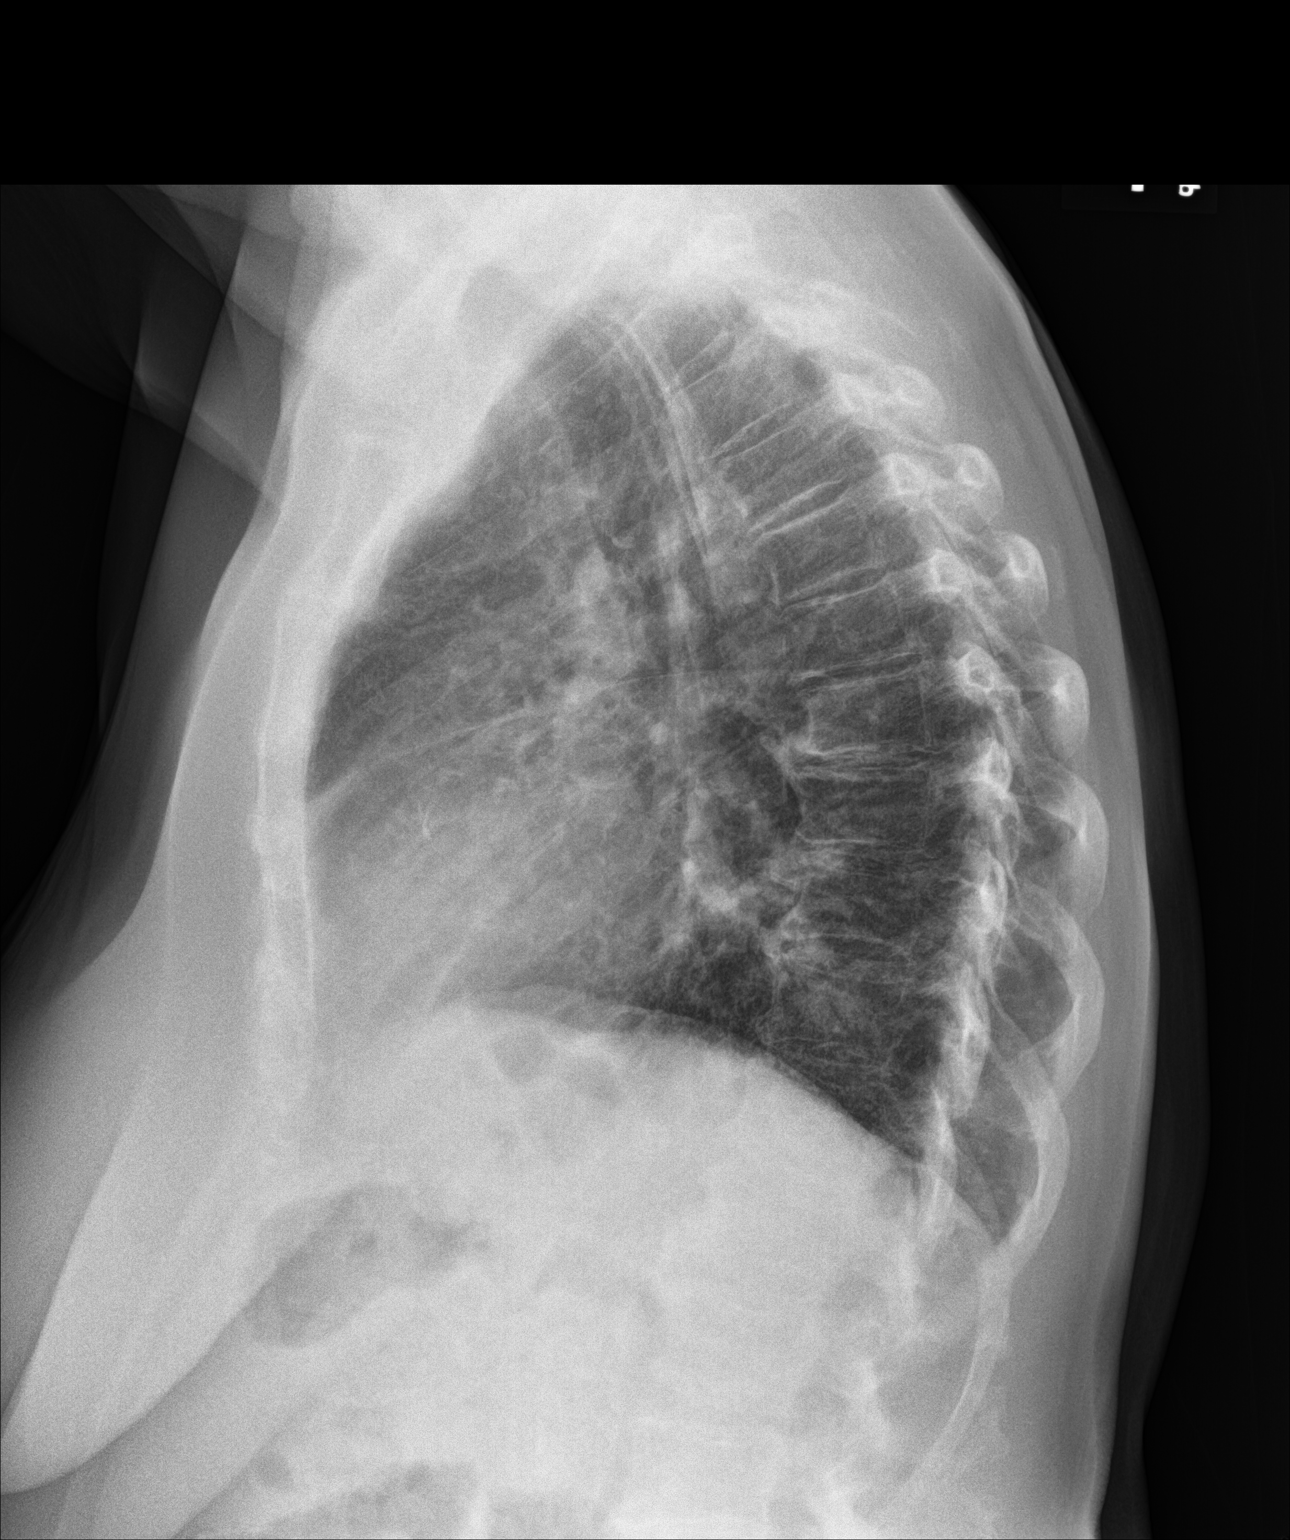

[2 of 2 positions shown; findings below may reference images not displayed]

FINDINGS: Enlargement of cardiac silhouette.

Atherosclerotic calcification aorta.

Mediastinal contours and pulmonary vascularity normal.

Diffuse interstitial thickening throughout both lungs unchanged.

Biapical scarring asymmetrically slightly greater on RIGHT,
unchanged.

No definite acute infiltrate, pleural effusion, or pneumothorax.

Bones unremarkable.
IMPRESSION: Persistent diffuse interstitial lung disease changes and biapical
scarring.

No new abnormalities.

## 2019-06-18 ENCOUNTER — Other Ambulatory Visit: Payer: Self-pay

## 2019-06-18 ENCOUNTER — Ambulatory Visit (INDEPENDENT_AMBULATORY_CARE_PROVIDER_SITE_OTHER): Payer: Medicare Other | Admitting: Family Medicine

## 2019-06-18 ENCOUNTER — Encounter: Payer: Self-pay | Admitting: Family Medicine

## 2019-06-18 VITALS — BP 164/69 | HR 68 | Temp 97.1°F | Ht 64.0 in | Wt 151.2 lb

## 2019-06-18 DIAGNOSIS — I6521 Occlusion and stenosis of right carotid artery: Secondary | ICD-10-CM

## 2019-06-18 DIAGNOSIS — E1169 Type 2 diabetes mellitus with other specified complication: Secondary | ICD-10-CM | POA: Diagnosis not present

## 2019-06-18 DIAGNOSIS — I350 Nonrheumatic aortic (valve) stenosis: Secondary | ICD-10-CM | POA: Diagnosis not present

## 2019-06-18 DIAGNOSIS — E785 Hyperlipidemia, unspecified: Secondary | ICD-10-CM

## 2019-06-18 DIAGNOSIS — E1159 Type 2 diabetes mellitus with other circulatory complications: Secondary | ICD-10-CM | POA: Diagnosis not present

## 2019-06-18 DIAGNOSIS — I10 Essential (primary) hypertension: Secondary | ICD-10-CM

## 2019-06-18 LAB — BAYER DCA HB A1C WAIVED: HB A1C (BAYER DCA - WAIVED): 7.9 % — ABNORMAL HIGH (ref <7.0)

## 2019-06-18 MED ORDER — LOSARTAN POTASSIUM 50 MG PO TABS: 50.0000 mg | ORAL_TABLET | Freq: Every day | ORAL | 0 refills | Status: DC

## 2019-06-18 NOTE — Progress Notes (Signed)
Subjective: CC: DM with hypertension hyperlipidemia PCP: Janora Norlander, DO MWN:[id number] E Emily Sanders is a 86 y.o. female presenting to clinic today for:  1. Type 2 Diabetes with hypertension and hyperlipidemia:  Diagnosed about 13 years ago.   Patient reports compliance with her Metformin 1000 mg twice daily, losartan 25 mg daily and Crestor 20 mg daily.  She reports low consumption of carbs and sugar.  She notices that bananas tend to elevate her blood sugar.  She has not missed any doses of her medication recently.  She is had no GI side effects with increased dose of the Metformin.  She notes that her blood pressures have been elevated on the last couple of checks and this is unusual for her.  Last eye exam: due Last foot exam: UTD Last A1c:  Lab Results  Component Value Date   HGBA1C 8.2 (H) 03/19/2019   Nephropathy screen indicated?: on ARB Last flu, zoster and/or pneumovax: PNA, TDap Immunization History  Administered Date(s) Administered  . Influenza, High Dose Seasonal PF 11/21/2017  . Influenza,inj,Quad PF,6+ Mos 10/20/2016  . Pneumococcal Conjugate-13 02/22/2018  . Zoster 08/27/2013    ROS: Denies dizziness, falls, chest pain, shortness of breath.  No Known Allergies Past Medical History:  Diagnosis Date  . Diabetes mellitus without complication ([place])   . Hyperlipidemia   . Hypertension   . Psoriasis     Current Outpatient Medications:  .  aspirin 81 MG tablet, Take 81 mg by mouth daily., Disp: , Rfl:  .  Blood Glucose Monitoring Suppl (BLOOD GLUCOSE MONITOR SYSTEM) w/Device KIT, 1 Device by Does not apply route 2 (two) times daily., Disp: 1 each, Rfl: 0 .  Cholecalciferol (VITAMIN D3 SUPER STRENGTH) 2000 units TABS, Take 1 tablet by mouth daily., Disp: , Rfl:  .  glucose blood test strip, Check blood glucose twice daily, Disp: 100 each, Rfl: 12 .  Lancets MISC, Twice daily, Disp: 100 each, Rfl: 11 .  losartan (COZAAR) 25 MG tablet, Take 1 tablet (25 mg  total) by mouth daily., Disp: 90 tablet, Rfl: 1 .  metFORMIN (GLUCOPHAGE) 1000 MG tablet, Take 1 tablet (1,000 mg total) by mouth 2 (two) times daily with a meal., Disp: 180 tablet, Rfl: 1 .  multivitamin-lutein (OCUVITE-LUTEIN) CAPS capsule, Take 1 capsule by mouth daily., Disp: , Rfl:  .  rosuvastatin (CRESTOR) 20 MG tablet, Take 1 tablet (20 mg total) by mouth at bedtime., Disp: 90 tablet, Rfl: 1 Social History   Socioeconomic History  . Marital status: Widowed    Spouse name: Not on file  . Number of children: Not on file  . Years of education: Not on file  . Highest education level: Not on file  Occupational History  . Not on file  Tobacco Use  . Smoking status: Former Research scientist (life sciences)  . Smokeless tobacco: Never Used  Substance and Sexual Activity  . Alcohol use: No  . Drug use: No  . Sexual activity: Not on file  Other Topics Concern  . Not on file  Social History Narrative  . Not on file   Social Determinants of Health   Financial Resource Strain:   . Difficulty of Paying Living Expenses:   Food Insecurity:   . Worried About Charity fundraiser in the Last Year:   . Arboriculturist in the Last Year:   Transportation Needs:   . Film/video editor (Medical):   Marland Kitchen Lack of Transportation (Non-Medical):   Physical Activity:   .  Days of Exercise per Week:   . Minutes of Exercise per Session:   Stress:   . Feeling of Stress :   Social Connections:   . Frequency of Communication with Friends and Family:   . Frequency of Social Gatherings with Friends and Family:   . Attends Religious Services:   . Active Member of Clubs or Organizations:   . Attends Archivist Meetings:   Marland Kitchen Marital Status:   Intimate Partner Violence:   . Fear of Current or Ex-Partner:   . Emotionally Abused:   Marland Kitchen Physically Abused:   . Sexually Abused:    Family History  Problem Relation Age of Onset  . Diabetes Mother   . Heart disease Father     Objective: Office vital signs  reviewed. BP (!) 164/69   Pulse 68   Temp (!) 97.1 F (36.2 C)   Ht _0  (1.626 m)   Wt 151 lb 3.2 oz (68.6 kg)   SpO2 97%   BMI 25.95 kg/m   Physical Examination:  General: Awake, alert, well nourished, No acute distress HEENT: Normal, sclera white, MMM Cardio: regular rate and rhythm, S1S2 heard, 3/6 SEM appreciated at bilateral sternal borders. Pulm: clear to auscultation bilaterally, no wheezes, rhonchi or rales; normal work of breathing on room air Extremities: warm, well perfused, No edema, cyanosis or clubbing; +2 pulses bilaterally. Varicose veins noted bilaterally  Assessment/ Plan: 86 y.o. female   1. Type 2 diabetes mellitus with other specified complication, without long-term current use of insulin (HCC) Improving but goal for this patient is around 7.5 A1c.  She wants to work on diet before adding additional meds.  See me in 3 months - Bayer DCA Hb A1c Waived  2. Hyperlipidemia associated with type 2 diabetes mellitus ([place]) Continue statin  3. Hypertension associated with diabetes ([place]) Not controlled on last several OVs. Increase Losartan to 2m.  Repeat BMP/ BP in 1 week with RN - Basic Metabolic Panel - losartan (COZAAR) 50 MG tablet; Take 1 tablet (50 mg total) by mouth daily.  Dispense: 90 tablet; Refill: 0 - BMP future  4. Nonrheumatic aortic (valve) stenosis  5. Carotid artery stenosis, asymptomatic, right   Orders Placed This Encounter  Procedures  . Bayer DCA Hb A1c Waived  . Basic Metabolic Panel   No orders of the defined types were placed in this encounter.    AJanora Norlander DO [place]([phone]

## 2019-06-19 LAB — BASIC METABOLIC PANEL
BUN/Creatinine Ratio: 22 (ref 12–28)
BUN: 18 mg/dL (ref 8–27)
CO2: 24 mmol/L (ref 20–29)
Calcium: 9 mg/dL (ref 8.7–10.3)
Chloride: 103 mmol/L (ref 96–106)
Creatinine, Ser: 0.83 mg/dL (ref 0.57–1.00)
GFR calc Af Amer: 73 mL/min/{1.73_m2} (ref >59)
GFR calc non Af Amer: 64 mL/min/{1.73_m2} (ref >59)
Glucose: 141 mg/dL — ABNORMAL HIGH (ref 65–99)
Potassium: 4 mmol/L (ref 3.5–5.2)
Sodium: 142 mmol/L (ref 134–144)

## 2019-06-25 ENCOUNTER — Other Ambulatory Visit: Payer: Self-pay

## 2019-06-25 ENCOUNTER — Ambulatory Visit: Payer: Medicare Other | Admitting: *Deleted

## 2019-06-25 VITALS — BP 159/69 | HR 64

## 2019-06-25 DIAGNOSIS — Z0130 Encounter for examination of blood pressure without abnormal findings: Secondary | ICD-10-CM

## 2019-06-25 NOTE — Progress Notes (Signed)
Pt came in today for 1 week BP check. Pt was supposed to increase her Losartan to 50mg  but pt has not yet. Spoke with Dr Darnell Level and she advised for pt to start the 50 mg Losartan and come back in 7-10 days for BP check and BMP. Pt was given a BP log to bring back with her and voiced understanding on increasing her Losartan.

## 2019-07-04 ENCOUNTER — Other Ambulatory Visit: Payer: Self-pay

## 2019-07-04 ENCOUNTER — Other Ambulatory Visit: Payer: Self-pay | Admitting: Family Medicine

## 2019-07-04 ENCOUNTER — Ambulatory Visit: Payer: Medicare Other

## 2019-07-04 DIAGNOSIS — E785 Hyperlipidemia, unspecified: Secondary | ICD-10-CM

## 2019-07-04 DIAGNOSIS — Z0130 Encounter for examination of blood pressure without abnormal findings: Secondary | ICD-10-CM

## 2019-07-04 DIAGNOSIS — E1159 Type 2 diabetes mellitus with other circulatory complications: Secondary | ICD-10-CM

## 2019-07-04 NOTE — Progress Notes (Signed)
Patient here today to have blood pressure checked and lab work.  Her blood pressure was 131/79, pulse 77.  Patient brought in blood pressure readings taken at home.  They are:  Tuesday AM 145/77, pulse 83; PM 125/71, pulse 93  Wednesday AM 131/79, pulse 92, PM 120/83,  Pulse 92  Thursday AM:  123/74, pulse 97

## 2019-07-05 LAB — BASIC METABOLIC PANEL
BUN/Creatinine Ratio: 22 (ref 12–28)
BUN: 18 mg/dL (ref 8–27)
CO2: 24 mmol/L (ref 20–29)
Calcium: 9.1 mg/dL (ref 8.7–10.3)
Chloride: 109 mmol/L — ABNORMAL HIGH (ref 96–106)
Creatinine, Ser: 0.82 mg/dL (ref 0.57–1.00)
GFR calc Af Amer: 74 mL/min/{1.73_m2} (ref >59)
GFR calc non Af Amer: 65 mL/min/{1.73_m2} (ref >59)
Glucose: 145 mg/dL — ABNORMAL HIGH (ref 65–99)
Potassium: 4.2 mmol/L (ref 3.5–5.2)
Sodium: 145 mmol/L — ABNORMAL HIGH (ref 134–144)

## 2019-09-10 ENCOUNTER — Other Ambulatory Visit: Payer: Self-pay | Admitting: Family Medicine

## 2019-09-10 DIAGNOSIS — E1159 Type 2 diabetes mellitus with other circulatory complications: Secondary | ICD-10-CM

## 2019-09-19 ENCOUNTER — Telehealth: Payer: Self-pay | Admitting: Family Medicine

## 2019-09-19 NOTE — Telephone Encounter (Signed)
Patient advised proper hand hygiene and to wear mask

## 2019-09-22 ENCOUNTER — Other Ambulatory Visit: Payer: Self-pay

## 2019-09-22 ENCOUNTER — Encounter: Payer: Self-pay | Admitting: Family Medicine

## 2019-09-22 ENCOUNTER — Ambulatory Visit (INDEPENDENT_AMBULATORY_CARE_PROVIDER_SITE_OTHER): Payer: Medicare Other | Admitting: Family Medicine

## 2019-09-22 VITALS — BP 101/59 | HR 88 | Temp 97.0°F | Ht 64.0 in | Wt 147.4 lb

## 2019-09-22 DIAGNOSIS — I152 Hypertension secondary to endocrine disorders: Secondary | ICD-10-CM

## 2019-09-22 DIAGNOSIS — E1169 Type 2 diabetes mellitus with other specified complication: Secondary | ICD-10-CM

## 2019-09-22 DIAGNOSIS — L409 Psoriasis, unspecified: Secondary | ICD-10-CM

## 2019-09-22 DIAGNOSIS — E1159 Type 2 diabetes mellitus with other circulatory complications: Secondary | ICD-10-CM

## 2019-09-22 DIAGNOSIS — Z23 Encounter for immunization: Secondary | ICD-10-CM

## 2019-09-22 DIAGNOSIS — I6521 Occlusion and stenosis of right carotid artery: Secondary | ICD-10-CM | POA: Diagnosis not present

## 2019-09-22 DIAGNOSIS — I10 Essential (primary) hypertension: Secondary | ICD-10-CM

## 2019-09-22 DIAGNOSIS — E785 Hyperlipidemia, unspecified: Secondary | ICD-10-CM

## 2019-09-22 DIAGNOSIS — I350 Nonrheumatic aortic (valve) stenosis: Secondary | ICD-10-CM | POA: Diagnosis not present

## 2019-09-22 LAB — BAYER DCA HB A1C WAIVED: HB A1C (BAYER DCA - WAIVED): 7.8 % — ABNORMAL HIGH (ref <7.0)

## 2019-09-22 MED ORDER — JANUMET 50-1000 MG PO TABS: 1.0000 | ORAL_TABLET | Freq: Two times a day (BID) | ORAL | 0 refills | Status: DC

## 2019-09-22 NOTE — Patient Instructions (Signed)
STOP metformin START Janumet.:  Take ONE (1) tablet TWICE daily

## 2019-09-22 NOTE — Progress Notes (Signed)
Subjective: CC: DM with hypertension hyperlipidemia PCP: Janora Norlander, DO EVO:[id number] E Emily Sanders is a 86 y.o. female presenting to clinic today for:  1. Type 2 Diabetes with hypertension and hyperlipidemia:  Diagnosed about 13 years ago.   Patient reports compliance with her Metformin 1000 mg twice daily, losartan 50 mg daily (increased last visit) and Crestor 20 mg daily.  A1c elevated last visit but she wanted to pursue additional lifestyle modification before adding medication.  She is been trying to be more physically active and eat better.  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  She will be working solely in [place] going forward.  Last eye exam: Up-to-date Last foot exam: UTD Last A1c:  Lab Results  Component Value Date   HGBA1C 7.9 (H) 06/18/2019   Nephropathy screen indicated?: on ARB Last flu, zoster and/or pneumovax: PNA, TDap Immunization History  Administered Date(s) Administered  . Influenza, High Dose Seasonal PF 11/21/2017  . Influenza,inj,Quad PF,6+ Mos 10/20/2016  . Moderna SARS-COVID-2 Vaccination 04/09/2019, 05/05/2019  . Pneumococcal Conjugate-13 02/22/2018  . Zoster 08/27/2013    ROS: Denies dizziness, falls, chest pain, shortness of breath.  No polyuria or polydipsia.  No Known Allergies Past Medical History:  Diagnosis Date  . Diabetes mellitus without complication ([place])   . Hyperlipidemia   . Hypertension   . Psoriasis     Current Outpatient Medications:  .  aspirin 81 MG tablet, Take 81 mg by mouth daily., Disp: , Rfl:  .  Blood Glucose Monitoring Suppl (BLOOD GLUCOSE MONITOR SYSTEM) w/Device KIT, 1 Device by Does not apply route 2 (two) times daily., Disp: 1 each, Rfl: 0 .  Cholecalciferol (VITAMIN D3 SUPER STRENGTH) 2000 units TABS, Take 1 tablet by mouth daily., Disp: , Rfl:  .  glucose blood test strip, Check blood glucose twice daily, Disp: 100 each, Rfl: 12 .  Lancets MISC, Twice  daily, Disp: 100 each, Rfl: 11 .  losartan (COZAAR) 50 MG tablet, Take 1 tablet by mouth once daily, Disp: 90 tablet, Rfl: 0 .  metFORMIN (GLUCOPHAGE) 1000 MG tablet, Take 1 tablet (1,000 mg total) by mouth 2 (two) times daily with a meal., Disp: 180 tablet, Rfl: 1 .  multivitamin-lutein (OCUVITE-LUTEIN) CAPS capsule, Take 1 capsule by mouth daily., Disp: , Rfl:  .  rosuvastatin (CRESTOR) 20 MG tablet, TAKE 1 TABLET BY MOUTH AT BEDTIME, Disp: 90 tablet, Rfl: 1 Social History   Socioeconomic History  . Marital status: Widowed    Spouse name: Not on file  . Number of children: Not on file  . Years of education: Not on file  . Highest education level: Not on file  Occupational History  . Not on file  Tobacco Use  . Smoking status: Former Research scientist (life sciences)  . Smokeless tobacco: Never Used  Vaping Use  . Vaping Use: Never used  Substance and Sexual Activity  . Alcohol use: No  . Drug use: No  . Sexual activity: Not on file  Other Topics Concern  . Not on file  Social History Narrative  . Not on file   Social Determinants of Health   Financial Resource Strain:   . Difficulty of Paying Living Expenses: Not on file  Food Insecurity:   . Worried About Charity fundraiser in the Last Year: Not on file  . Ran Out of Food in the Last Year: Not on file  Transportation Needs:   . Lack of Transportation (Medical): Not  on file  . Lack of Transportation (Non-Medical): Not on file  Physical Activity:   . Days of Exercise per Week: Not on file  . Minutes of Exercise per Session: Not on file  Stress:   . Feeling of Stress : Not on file  Social Connections:   . Frequency of Communication with Friends and Family: Not on file  . Frequency of Social Gatherings with Friends and Family: Not on file  . Attends Religious Services: Not on file  . Active Member of Clubs or Organizations: Not on file  . Attends Archivist Meetings: Not on file  . Marital Status: Not on file  Intimate Partner  Violence:   . Fear of Current or Ex-Partner: Not on file  . Emotionally Abused: Not on file  . Physically Abused: Not on file  . Sexually Abused: Not on file   Family History  Problem Relation Age of Onset  . Diabetes Mother   . Heart disease Father     Objective: Office vital signs reviewed. BP (!) 101/59   Pulse 88   Temp (!) 97 F (36.1 C)   Ht '5\' 4"'  (1.626 m)   Wt 147 lb 6.4 oz (66.9 kg)   SpO2 98%   BMI 25.30 kg/m   Physical Examination:  General: Awake, alert, well nourished, No acute distress HEENT: Normal, sclera white, MMM Cardio: regular rate and rhythm, S1S2 heard, 3/6 SEM appreciated at bilateral sternal borders. Pulm: clear to auscultation bilaterally, no wheezes, rhonchi or rales; normal work of breathing on room air Extremities: warm, well perfused, No edema, cyanosis or clubbing   Assessment/ Plan: 86 y.o. female   1. Type 2 diabetes mellitus with other specified complication, without long-term current use of insulin (HCC) A1c not well controlled at 7.8 today.  Added Januvia to her regimen.  Plan to recheck in 3 months - Bayer DCA Hb A1c Waived - sitaGLIPtin-metformin (JANUMET) 50-1000 MG tablet; Take 1 tablet by mouth 2 (two) times daily with a meal.  Dispense: 180 tablet; Refill: 0 - Amb ref to Medical Nutrition Therapy-MNT  2. Hyperlipidemia associated with type 2 diabetes mellitus (HCC) Check lipid panel - CMP14+EGFR - Lipid Panel - TSH  3. Hypertension associated with diabetes ([place]) Borderline low.  May need to consider backing back down on the losartan.  She is currently asymptomatic - CMP14+EGFR  4. Carotid artery stenosis, asymptomatic, right - Lipid Panel - TSH  5. Nonrheumatic aortic (valve) stenosis - TSH  6. Psoriasis Stable - CBC with Differential  7. Need for vaccination for Strep pneumoniae Administered - Pneumococcal polysaccharide vaccine 23-valent greater than or equal to 2yo subcutaneous/IM   No orders of the defined  types were placed in this encounter.  No orders of the defined types were placed in this encounter.    Janora Norlander, DO [place] [phone]

## 2019-09-23 LAB — LIPID PANEL
Chol/HDL Ratio: 2.9 ratio (ref 0.0–4.4)
Cholesterol, Total: 124 mg/dL (ref 100–199)
HDL: 43 mg/dL (ref >39)
LDL Chol Calc (NIH): 61 mg/dL (ref 0–99)
Triglycerides: 106 mg/dL (ref 0–149)
VLDL Cholesterol Cal: 20 mg/dL (ref 5–40)

## 2019-09-23 LAB — CBC WITH DIFFERENTIAL/PLATELET
Basophils Absolute: 0.1 10*3/uL (ref 0.0–0.2)
Basos: 1 %
EOS (ABSOLUTE): 0 10*3/uL (ref 0.0–0.4)
Eos: 0 %
Hematocrit: 41.1 % (ref 34.0–46.6)
Hemoglobin: 13.6 g/dL (ref 11.1–15.9)
Immature Grans (Abs): 0.1 10*3/uL (ref 0.0–0.1)
Immature Granulocytes: 1 %
Lymphocytes Absolute: 1.7 10*3/uL (ref 0.7–3.1)
Lymphs: 17 %
MCH: 28.6 pg (ref 26.6–33.0)
MCHC: 33.1 g/dL (ref 31.5–35.7)
MCV: 87 fL (ref 79–97)
Monocytes Absolute: 1.3 10*3/uL — ABNORMAL HIGH (ref 0.1–0.9)
Monocytes: 14 %
Neutrophils Absolute: 6.4 10*3/uL (ref 1.4–7.0)
Neutrophils: 67 %
Platelets: 252 10*3/uL (ref 150–450)
RBC: 4.75 x10E6/uL (ref 3.77–5.28)
RDW: 12.7 % (ref 11.7–15.4)
WBC: 9.5 10*3/uL (ref 3.4–10.8)

## 2019-09-23 LAB — CMP14+EGFR
ALT: 9 IU/L (ref 0–32)
AST: 13 IU/L (ref 0–40)
Albumin/Globulin Ratio: 1.4 (ref 1.2–2.2)
Albumin: 4.2 g/dL (ref 3.6–4.6)
Alkaline Phosphatase: 74 IU/L (ref 48–121)
BUN/Creatinine Ratio: 22 (ref 12–28)
BUN: 18 mg/dL (ref 8–27)
Bilirubin Total: 1.4 mg/dL — ABNORMAL HIGH (ref 0.0–1.2)
CO2: 22 mmol/L (ref 20–29)
Calcium: 9.2 mg/dL (ref 8.7–10.3)
Chloride: 99 mmol/L (ref 96–106)
Creatinine, Ser: 0.83 mg/dL (ref 0.57–1.00)
GFR calc Af Amer: 73 mL/min/{1.73_m2} (ref >59)
GFR calc non Af Amer: 64 mL/min/{1.73_m2} (ref >59)
Globulin, Total: 2.9 g/dL (ref 1.5–4.5)
Glucose: 137 mg/dL — ABNORMAL HIGH (ref 65–99)
Potassium: 3.9 mmol/L (ref 3.5–5.2)
Sodium: 139 mmol/L (ref 134–144)
Total Protein: 7.1 g/dL (ref 6.0–8.5)

## 2019-09-23 LAB — TSH: TSH: 4.1 u[IU]/mL (ref 0.450–4.500)

## 2019-09-24 NOTE — Progress Notes (Signed)
Cardiology Office Note  Date: 09/25/2019   ID: CONLEY PAWLING, DOB 1930/02/13, MRN [id number]  PCP:  Janora Norlander, DO  Cardiologist:  No primary care provider on file. Electrophysiologist:  None   Chief Complaint: Nonrheumatic aortic valve stenosis  History of Present Illness: Emily Sanders is a 86 y.o. female with a history of nonrheumatic aortic valve stenosis, bilateral internal carotid artery stenosis (LICA stenting [place] 2007), HTN, HLD, type 2 diabetes.  Echocardiogram 11/27/2018 mild aortic stenosis and vigorous LV systolic function EF 65 to 70%.  Last seen by Dr. Bronson Ing 05/27/2019. At that time she denied any chest pain, palpitations, shortness of breath, lightheadedness, dizziness, leg swelling, orthopnea, PND, syncope. She had not taken her medications at that time.  Her aortic stenosis was mild in severity by echo in November 2020. Carotid Dopplers  10/23/2018 showed large amount of right-sided atherosclerotic plaque, morphologically similar to 2019 exam. 50 to 69% right ICA stenosis. No significant LICA stenosis. Previously LICA stent placed in 2007. Dr. Bronson Ing stated carotid doppler should be repeated in September 2021. She was continuing her aspirin and rosuvastatin. Her blood pressure was elevated on that visit but she had not yet taken her medications that morning.  Patient states she has been doing well since last visit with Dr. Bronson Ing.  She denies any recent acute illnesses, hospitalizations.  No anginal or exertional symptoms, palpitations or arrhythmias, orthostatic symptoms, CVA or TIA-like symptoms, dizziness, lightheadedness, presyncope or syncope.  Denies any bleeding issues, PND, orthopnea, claudication-like symptoms, DVT or PE-like symptoms, or lower extremity edema.  Blood pressure is up today.  Patient states she recently had a visit with her primary care provider who stated her blood pressure was within normal limits.  Patient states she  recently saw Dr. Lajuana Ripple and was prescribed Janumet which she just started for her diabetes.  Her most recent hemoglobin A1c was 7.8.   Past Medical History:  Diagnosis Date  . Diabetes mellitus without complication ([place])   . Hyperlipidemia   . Hypertension   . Psoriasis     Past Surgical History:  Procedure Laterality Date  . ABDOMINAL HYSTERECTOMY    . APPENDECTOMY    . BLADDER REPAIR    . CAROTID STENT INSERTION Left   . CHOLECYSTECTOMY      Current Outpatient Medications  Medication Sig Dispense Refill  . aspirin 81 MG tablet Take 81 mg by mouth daily.    . Blood Glucose Monitoring Suppl (BLOOD GLUCOSE MONITOR SYSTEM) w/Device KIT 1 Device by Does not apply route 2 (two) times daily. 1 each 0  . Cholecalciferol (VITAMIN D3 SUPER STRENGTH) 2000 units TABS Take 1 tablet by mouth 2 (two) times a week.     Marland Kitchen glucose blood test strip Check blood glucose twice daily 100 each 12  . Lancets MISC Twice daily 100 each 11  . losartan (COZAAR) 50 MG tablet Take 1 tablet by mouth once daily 90 tablet 0  . rosuvastatin (CRESTOR) 20 MG tablet TAKE 1 TABLET BY MOUTH AT BEDTIME 90 tablet 1  . sitaGLIPtin-metformin (JANUMET) 50-1000 MG tablet Take 1 tablet by mouth 2 (two) times daily with a meal. 180 tablet 0   No current facility-administered medications for this visit.   Allergies:  Patient has no known allergies.   Social History: The patient  reports that she has quit smoking. She has never used smokeless tobacco. She reports that she does not drink alcohol and does not use drugs.   Family  History: The patient's family history includes Diabetes in her mother; Heart disease in her father.   ROS:  Please see the history of present illness. Otherwise, complete review of systems is positive for none.  All other systems are reviewed and negative.   Physical Exam: VS:  BP 122/80   Pulse 69   Ht _0  (1.626 m)   Wt 147 lb 12.8 oz (67 kg)   SpO2 99%   BMI 25.37 kg/m , BMI Body mass  index is 25.37 kg/m.  Wt Readings from Last 3 Encounters:  09/25/19 147 lb 12.8 oz (67 kg)  09/22/19 147 lb 6.4 oz (66.9 kg)  06/18/19 151 lb 3.2 oz (68.6 kg)    General: Patient appears comfortable at rest. Neck: Supple, no elevated JVP or carotid bruits, no thyromegaly. Lungs: Clear to auscultation, nonlabored breathing at rest. Cardiac: Regular rate and rhythm, no S3 or significant systolic murmur, no pericardial rub. Extremities: No pitting edema, distal pulses 2+. Skin: Warm and dry. Musculoskeletal: No kyphosis. Neuropsychiatric: Alert and oriented x3, affect grossly appropriate.  ECG:  An ECG dated 09/25/2019 was personally reviewed today and demonstrated:  Normal sinus rhythm RBBB, LAFB, moderate voltage criteria for LVH, may be normal variant.  Recent Labwork: 09/22/2019: ALT 9; AST 13; BUN 18; Creatinine, Ser 0.83; Hemoglobin 13.6; Platelets 252; Potassium 3.9; Sodium 139; TSH 4.100     Component Value Date/Time   CHOL 124 09/22/2019 0804   TRIG 106 09/22/2019 0804   HDL 43 09/22/2019 0804   CHOLHDL 2.9 09/22/2019 0804   LDLCALC 61 09/22/2019 0804    Other Studies Reviewed Today:  Echocardiogram 11/27/2018:  1. Left ventricular ejection fraction, by visual estimation, is 65 to  70%. The left ventricle has hyperdynamic function. There is moderately  increased left ventricular hypertrophy.  2. Left ventricular diastolic parameters are indeterminate.  3. Global right ventricle has normal systolic function.The right  ventricular size is normal. No increase in right ventricular wall  thickness.  4. Left atrial size was normal.  5. Right atrial size was normal.  6. Mild mitral annular calcification.  7. The mitral valve is abnormal. No evidence of mitral valve  regurgitation. No evidence of mitral stenosis.  8. The tricuspid valve is normal in structure. Tricuspid valve  regurgitation is not demonstrated.  9. The aortic valve is tricuspid. Aortic valve  regurgitation is not  visualized. Mild aortic valve stenosis.  10. The pulmonic valve was not well visualized. Pulmonic valve  regurgitation is not visualized.  11. The inferior vena cava is normal in size with greater than 50%  respiratory variability, suggesting right atrial pressure of 3 mmHg.   In comparison to the previous echocardiogram(s): Echocardiogram done  03/05/17 showed an EFof 65% with mild AS and an AV Peak Grad of 26 mmHg.   Assessment and Plan:  1. Aortic valve stenosis, etiology of cardiac valve disease unspecified   2. Bilateral carotid artery stenosis   3. Mixed hyperlipidemia   4. Essential hypertension    .1. Aortic valve stenosis, etiology of cardiac valve disease unspecified Echocardiogram in 2020 showed mild aortic valve stenosis.  2. Bilateral carotid artery stenosis  Carotid Dopplers  10/23/2018 showed large amount of right-sided atherosclerotic plaque, morphologically similar to 2019 exam. 50 to 69% right ICA stenosis. No significant LICA stenosis. Previously LICA stent placed in 2007.  Repeat carotid studies in September 2021.  Continue aspirin 81 mg daily.  Continue Crestor 20 mg daily.  3. Mixed hyperlipidemia Continue Crestor 20  mg daily.  Recent labs at PCP office showed TC 124, TG 106, HDL 43, LDL 61.  4. Essential hypertension Blood pressure elevated at 158/82.  Recheck in right arm 122/80.  Advised patient to start checking her blood pressure daily for the next 2 weeks and call us with blood pressure measurements in 2 weeks.  \Medication Adjustments/Labs and Tests Ordered: Current medicines are reviewed at length with the patient today.  Concerns regarding medicines are outlined above.   Disposition: Follow-up with Dr. Domenic Polite or APP 6 months  Signed, Levell July, NP 09/25/2019 9:18 AM    [facility] [address], [place], [place] [address] Phone: [phone]; Fax: [phone]

## 2019-09-25 ENCOUNTER — Ambulatory Visit (INDEPENDENT_AMBULATORY_CARE_PROVIDER_SITE_OTHER): Payer: Medicare Other | Admitting: Family Medicine

## 2019-09-25 ENCOUNTER — Other Ambulatory Visit: Payer: Self-pay

## 2019-09-25 ENCOUNTER — Encounter: Payer: Self-pay | Admitting: Family Medicine

## 2019-09-25 VITALS — BP 122/80 | HR 69 | Ht 64.0 in | Wt 147.8 lb

## 2019-09-25 DIAGNOSIS — I10 Essential (primary) hypertension: Secondary | ICD-10-CM | POA: Diagnosis not present

## 2019-09-25 DIAGNOSIS — I350 Nonrheumatic aortic (valve) stenosis: Secondary | ICD-10-CM

## 2019-09-25 DIAGNOSIS — E782 Mixed hyperlipidemia: Secondary | ICD-10-CM

## 2019-09-25 DIAGNOSIS — I6523 Occlusion and stenosis of bilateral carotid arteries: Secondary | ICD-10-CM

## 2019-09-25 NOTE — Patient Instructions (Signed)
Medication Instructions:  Continue all current medications.  Labwork: none  Testing/Procedures:  Your physician has requested that you have a carotid duplex. This test is an ultrasound of the carotid arteries in your neck. It looks at blood flow through these arteries that supply the brain with blood. Allow one hour for this exam. There are no restrictions or special instructions.  Office will contact with results via phone or letter.    Follow-Up: 6 months   Any Other Special Instructions Will Be Listed Below (If Applicable). Your physician has requested that you regularly monitor and record your blood pressure readings at home. Please use the same machine at the same time of day to check your readings x 2 weeks and record them for provider review.   If you need a refill on your cardiac medications before your next appointment, please call your pharmacy.

## 2019-10-01 ENCOUNTER — Other Ambulatory Visit: Payer: Self-pay

## 2019-10-01 ENCOUNTER — Ambulatory Visit (INDEPENDENT_AMBULATORY_CARE_PROVIDER_SITE_OTHER): Payer: Medicare Other

## 2019-10-01 DIAGNOSIS — I6523 Occlusion and stenosis of bilateral carotid arteries: Secondary | ICD-10-CM | POA: Diagnosis not present

## 2019-10-03 ENCOUNTER — Telehealth: Payer: Self-pay | Admitting: *Deleted

## 2019-10-03 NOTE — Telephone Encounter (Signed)
-----   Message from Verta Ellen., NP sent at 10/03/2019  8:53 AM EDT ----- Please call the patient and let her know her carotid studies show she has some medium narrowing in her right internal carotid artery and some mild narrowing in her left internal carotid artery where she had the previous endarterectomy.  Tell her we will keep an eye on the right coronary artery by doing yearly carotid artery studies.  Thank you

## 2019-10-03 NOTE — Telephone Encounter (Signed)
Pt voiced understanding

## 2019-11-03 ENCOUNTER — Ambulatory Visit: Payer: Medicare Other | Admitting: Nutrition

## 2019-11-18 ENCOUNTER — Other Ambulatory Visit: Payer: Self-pay

## 2019-11-18 ENCOUNTER — Other Ambulatory Visit: Payer: Self-pay | Admitting: *Deleted

## 2019-11-18 MED ORDER — GLUCOSE BLOOD VI STRP: ORAL_STRIP | 10 refills | Status: AC

## 2019-11-18 MED ORDER — FREESTYLE LANCETS MISC: 10 refills | Status: AC

## 2019-11-18 MED ORDER — FREESTYLE LANCETS MISC: 3 refills | Status: DC

## 2019-11-27 ENCOUNTER — Other Ambulatory Visit: Payer: Self-pay | Admitting: Family Medicine

## 2019-11-27 DIAGNOSIS — E1159 Type 2 diabetes mellitus with other circulatory complications: Secondary | ICD-10-CM

## 2019-11-27 DIAGNOSIS — I152 Hypertension secondary to endocrine disorders: Secondary | ICD-10-CM

## 2019-12-23 ENCOUNTER — Ambulatory Visit (INDEPENDENT_AMBULATORY_CARE_PROVIDER_SITE_OTHER): Payer: Medicare Other | Admitting: Family Medicine

## 2019-12-23 ENCOUNTER — Encounter: Payer: Self-pay | Admitting: Family Medicine

## 2019-12-23 ENCOUNTER — Other Ambulatory Visit: Payer: Self-pay

## 2019-12-23 VITALS — BP 162/79 | HR 79 | Temp 97.8°F | Ht 64.0 in | Wt 145.0 lb

## 2019-12-23 DIAGNOSIS — Z23 Encounter for immunization: Secondary | ICD-10-CM | POA: Diagnosis not present

## 2019-12-23 DIAGNOSIS — E785 Hyperlipidemia, unspecified: Secondary | ICD-10-CM

## 2019-12-23 DIAGNOSIS — E1169 Type 2 diabetes mellitus with other specified complication: Secondary | ICD-10-CM | POA: Insufficient documentation

## 2019-12-23 DIAGNOSIS — E1159 Type 2 diabetes mellitus with other circulatory complications: Secondary | ICD-10-CM

## 2019-12-23 DIAGNOSIS — I152 Hypertension secondary to endocrine disorders: Secondary | ICD-10-CM

## 2019-12-23 LAB — BAYER DCA HB A1C WAIVED: HB A1C (BAYER DCA - WAIVED): 6.9 % (ref <7.0)

## 2019-12-23 MED ORDER — LOSARTAN POTASSIUM 50 MG PO TABS: ORAL_TABLET | ORAL | 0 refills | Status: DC

## 2019-12-23 NOTE — Progress Notes (Signed)
Subjective: CC: Follow-up diabetes PCP: Janora Norlander, DO ONG:[id number] E Calk is a 86 y.o. female presenting to clinic today for:  1.Type 2 Diabetes w/ hyperlipidemia:  On last visit patient's Metformin was discontinued and she was started on Janumet twice daily.  She was continued on her Crestor.  She reports good tolerance of the Janumet.  She states that blood sugars have been under good control at home.  Denies any chest pain, shortness of breath, visual disturbance.  No falls or dizziness.  She has not been on her losartan lately because when she went to the pharmacy she was told that she did not have any refills.  She saw her cardiologist back in September and had variable blood pressures upon recheck it was within normal range.  She does have a blood pressure cuff at home.   Last eye exam: needs Last foot exam: UTD Last A1c:  Lab Results  Component Value Date   HGBA1C 7.8 (H) 09/22/2019   Nephropathy screen indicated?: UTD Last flu, zoster and/or pneumovax:  Immunization History  Administered Date(s) Administered  . Influenza, High Dose Seasonal PF 11/21/2017  . Influenza,inj,Quad PF,6+ Mos 10/20/2016  . Moderna SARS-COVID-2 Vaccination 04/09/2019, 05/05/2019  . Pneumococcal Conjugate-13 02/22/2018  . Pneumococcal Polysaccharide-23 09/22/2019  . Zoster 08/27/2013   ROS: Per HPI  No Known Allergies Past Medical History:  Diagnosis Date  . Diabetes mellitus without complication ([place])   . Hyperlipidemia   . Hypertension   . Psoriasis     Current Outpatient Medications:  .  Blood Glucose Monitoring Suppl (BLOOD GLUCOSE MONITOR SYSTEM) w/Device KIT, 1 Device by Does not apply route 2 (two) times daily., Disp: 1 each, Rfl: 0 .  Cholecalciferol (VITAMIN D3 SUPER STRENGTH) 2000 units TABS, Take 1 tablet by mouth 2 (two) times a week. , Disp: , Rfl:  .  glucose blood test strip, Check blood glucose twice daily, Disp: 100 each, Rfl: 10 .  Lancets (FREESTYLE)  lancets, Test Bs twice daily Dx E11.9, Disp: 100 each, Rfl: 10 .  rosuvastatin (CRESTOR) 20 MG tablet, TAKE 1 TABLET BY MOUTH AT BEDTIME, Disp: 90 tablet, Rfl: 1 .  sitaGLIPtin-metformin (JANUMET) 50-1000 MG tablet, Take 1 tablet by mouth 2 (two) times daily with a meal., Disp: 180 tablet, Rfl: 0 Social History   Socioeconomic History  . Marital status: Widowed    Spouse name: Not on file  . Number of children: Not on file  . Years of education: Not on file  . Highest education level: Not on file  Occupational History  . Not on file  Tobacco Use  . Smoking status: Former Research scientist (life sciences)  . Smokeless tobacco: Never Used  Vaping Use  . Vaping Use: Never used  Substance and Sexual Activity  . Alcohol use: No  . Drug use: No  . Sexual activity: Not on file  Other Topics Concern  . Not on file  Social History Narrative  . Not on file   Social Determinants of Health   Financial Resource Strain:   . Difficulty of Paying Living Expenses: Not on file  Food Insecurity:   . Worried About Charity fundraiser in the Last Year: Not on file  . Ran Out of Food in the Last Year: Not on file  Transportation Needs:   . Lack of Transportation (Medical): Not on file  . Lack of Transportation (Non-Medical): Not on file  Physical Activity:   . Days of Exercise per Week: Not on file  .  Minutes of Exercise per Session: Not on file  Stress:   . Feeling of Stress : Not on file  Social Connections:   . Frequency of Communication with Friends and Family: Not on file  . Frequency of Social Gatherings with Friends and Family: Not on file  . Attends Religious Services: Not on file  . Active Member of Clubs or Organizations: Not on file  . Attends Archivist Meetings: Not on file  . Marital Status: Not on file  Intimate Partner Violence:   . Fear of Current or Ex-Partner: Not on file  . Emotionally Abused: Not on file  . Physically Abused: Not on file  . Sexually Abused: Not on file   Family  History  Problem Relation Age of Onset  . Diabetes Mother   . Heart disease Father     Objective: Office vital signs reviewed. BP (!) 162/79   Pulse 79   Temp 97.8 F (36.6 C) (Temporal)   Ht '5\' 4"'  (1.626 m)   Wt 145 lb (65.8 kg)   SpO2 98%   BMI 24.89 kg/m   Physical Examination:  General: Awake, alert, well nourished, No acute distress HEENT: Normal, sclera white, MMM Cardio: regular rate and rhythm, S1S2 heard, no murmurs appreciated Pulm: clear to auscultation bilaterally, no wheezes, rhonchi or rales; normal work of breathing on room air Extremities: warm, well perfused, No edema, cyanosis or clubbing; +2 pulses bilaterally MSK: normal gait and station  Assessment/ Plan: 86 y.o. female   Type 2 diabetes mellitus with other specified complication, without long-term current use of insulin (HCC) - Plan: Bayer DCA Hb A1c Waived  Hyperlipidemia associated with type 2 diabetes mellitus ([place])  Hypertension associated with diabetes ([place]) - Plan: losartan (COZAAR) 50 MG tablet  Sugars under excellent control now with A1c of 6.9.  Continue current regimen.  Continue statin.  Losartan restarted.  Start with 25 mg daily for the next 2 weeks.  Monitor blood pressures at home.  If blood pressures remain above 140/90, plan to increase to 50 mg daily.  At our last visit her blood pressure was borderline hypotensive but she was normotensive at her cardiologist visit.  Patient may just have labile blood pressures and we may need to adjust for this.  She will drop off a copy of her blood pressure log in a couple of weeks.  Her influenza vaccination was administered today.  She will get her eye exam scheduled.  I counseled her on the importance of this.  Orders Placed This Encounter  Procedures  . Bayer DCA Hb A1c Waived   No orders of the defined types were placed in this encounter.    Janora Norlander, DO [place] [phone]

## 2019-12-23 NOTE — Patient Instructions (Addendum)
Sugar looks AWESOME!   It is perfect at 6.9 today.  We are adding back Losartan for your blood pressure.  Start with 1/2 tablet of the Losartan.  If in 2 weeks your top number is above 140 or your bottom number remains above 90, increase to 1 full tablet daily.

## 2020-03-09 DIAGNOSIS — M79672 Pain in left foot: Secondary | ICD-10-CM | POA: Diagnosis not present

## 2020-03-09 DIAGNOSIS — L110 Acquired keratosis follicularis: Secondary | ICD-10-CM | POA: Diagnosis not present

## 2020-03-09 DIAGNOSIS — E1140 Type 2 diabetes mellitus with diabetic neuropathy, unspecified: Secondary | ICD-10-CM | POA: Diagnosis not present

## 2020-03-09 DIAGNOSIS — I739 Peripheral vascular disease, unspecified: Secondary | ICD-10-CM | POA: Diagnosis not present

## 2020-03-09 DIAGNOSIS — M79671 Pain in right foot: Secondary | ICD-10-CM | POA: Diagnosis not present

## 2020-03-09 DIAGNOSIS — M79674 Pain in right toe(s): Secondary | ICD-10-CM | POA: Diagnosis not present

## 2020-03-09 DIAGNOSIS — M79675 Pain in left toe(s): Secondary | ICD-10-CM | POA: Diagnosis not present

## 2020-03-23 ENCOUNTER — Ambulatory Visit: Payer: Medicare Other | Admitting: Family Medicine

## 2020-04-01 NOTE — Progress Notes (Signed)
Cardiology Office Note  Date: 04/02/2020   ID: [name], DOB 1930-07-08, MRN [id number]  PCP:  Emily Norlander, DO  Cardiologist:  No primary care provider on file. Electrophysiologist:  None   Chief Complaint: Nonrheumatic aortic valve stenosis  History of Present Illness: Emily Sanders is a 87 y.o. female with a history of nonrheumatic aortic valve stenosis, bilateral internal carotid artery stenosis (LICA stenting [place] 2007), HTN, HLD, type 2 diabetes.  Echocardiogram 11/27/2018 mild aortic stenosis and vigorous LV systolic function EF 65 to 70%.  Last seen by Dr. Bronson Sanders 05/27/2019. At that time she denied any chest pain, palpitations, shortness of breath, lightheadedness, dizziness, leg swelling, orthopnea, PND, syncope. She had not taken her medications at that time.  Her aortic stenosis was mild in severity by echo in November 2020. Carotid Dopplers  10/23/2018 showed large amount of right-sided atherosclerotic plaque, morphologically similar to 2019 exam. 50 to 69% right ICA stenosis. No significant LICA stenosis. Previously LICA stent placed in 2007. Dr. Bronson Sanders stated carotid doppler should be repeated in September 2021. She was continuing her aspirin and rosuvastatin. Her blood pressure was elevated on that visit but she had not yet taken her medications that morning.  She is here today for 60-monthfollow-up.  She denies any recent acute illnesses or hospitalizations in the interim since last visit.  No anginal or exertional symptoms, orthostatic symptoms, lightheadedness, dizziness, presyncopal or syncopal episodes.  No CVA or TIA-like symptoms.  Denies any PND, orthopnea, palpitations.  Denies any claudication-like symptoms, DVT or PE-like symptoms.  No lower extremity edema.  She states her PCP had her on some blood pressure medication losartan but her blood pressure has been well controlled without taking the medication.  She states she has not taken  the medication for several weeks now.  States she bought a new blood pressure cuff and blood pressures have been fine.  Blood pressure today on arrival was 122/58.   Past Medical History:  Diagnosis Date  . [place]   . Hyperlipidemia   . Hypertension   . Psoriasis     Past Surgical History:  Procedure Laterality Date  . ABDOMINAL HYSTERECTOMY    . APPENDECTOMY    . BLADDER REPAIR    . CAROTID STENT INSERTION Left   . CHOLECYSTECTOMY      Current Outpatient Medications  Medication Sig Dispense Refill  . Blood Glucose Monitoring Suppl (BLOOD GLUCOSE MONITOR SYSTEM) w/Device KIT 1 Device by Does not apply route 2 (two) times daily. 1 each 0  . Cholecalciferol 50 MCG (2000 UT) TABS Take 1 tablet by mouth 2 (two) times a week.     .Marland Kitchenglucose blood test strip Check blood glucose twice daily 100 each 10  . Lancets (FREESTYLE) lancets Test Bs twice daily Dx E11.9 100 each 10  . rosuvastatin (CRESTOR) 20 MG tablet TAKE 1 TABLET BY MOUTH AT BEDTIME 90 tablet 1  . sitaGLIPtin-metformin (JANUMET) 50-1000 MG tablet Take 1 tablet by mouth 2 (two) times daily with a meal. 180 tablet 0   No current facility-administered medications for this visit.   Allergies:  Patient has no known allergies.   Social History: The patient  reports that she has quit smoking. She has never used smokeless tobacco. She reports that she does not drink alcohol and does not use drugs.   Family History: The patient's family history includes Diabetes in her mother; Heart disease in her father.   ROS:  Please see the history of present illness. Otherwise, complete review of systems is positive for none.  All other systems are reviewed and negative.   Physical Exam: VS:  BP (!) 122/58   Pulse 88   Ht '5\' 4"'  (1.626 m)   Wt 151 lb (68.5 kg)   SpO2 94%   BMI 25.92 kg/m , BMI Body mass index is 25.92 kg/m.  Wt Readings from Last 3 Encounters:  04/02/20 151 lb (68.5 kg)  12/23/19  145 lb (65.8 kg)  09/25/19 147 lb 12.8 oz (67 kg)    General: Patient appears comfortable at rest. Neck: Supple, no elevated JVP or bilateral carotid artery bruits, no thyromegaly. Lungs: Clear to auscultation, nonlabored breathing at rest. Cardiac: Regular rate and rhythm, no S3, 2-7/7 systolic murmur best heard at right upper sternal border., no pericardial rub. Extremities: No pitting edema, distal pulses 2+. Skin: Warm and dry. Musculoskeletal: No kyphosis. Neuropsychiatric: Alert and oriented x3, affect grossly appropriate.  ECG:  An ECG dated 09/25/2019 was personally reviewed today and demonstrated:  Normal sinus rhythm RBBB, LAFB, moderate voltage criteria for LVH, may be normal variant.  Recent Labwork: 09/22/2019: ALT 9; AST 13; BUN 18; Creatinine, Ser 0.83; Hemoglobin 13.6; Platelets 252; Potassium 3.9; Sodium 139; TSH 4.100     Component Value Date/Time   CHOL 124 09/22/2019 0804   TRIG 106 09/22/2019 0804   HDL 43 09/22/2019 0804   CHOLHDL 2.9 09/22/2019 0804   [place] 61 09/22/2019 0804    Other Studies Reviewed Today:  Carotid artery Doppler study 10/01/2019 Right Carotid: Velocities in the right ICA are consistent with a 40-59% stenosis. Left Carotid: Velocities in the left ICA are consistent with a 1-39% stenosis. S/p left CEA with patch angioplasty. Vertebrals: Bilateral vertebral arteries demonstrate antegrade flow. Subclavians: Bilateral subclavian artery flow was disturbed. Proximal subclavian arteries with elevated velocities, normalizes by mid subclavian artery.  Echocardiogram 11/27/2018:  1. Left ventricular ejection fraction, by visual estimation, is 65 to  70%. The left ventricle has hyperdynamic function. There is moderately  increased left ventricular hypertrophy.  2. Left ventricular diastolic parameters are indeterminate.  3. Global right ventricle has normal systolic function.The right  ventricular size is normal. No increase in right ventricular  wall  thickness.  4. Left atrial size was normal.  5. Right atrial size was normal.  6. Mild mitral annular calcification.  7. The mitral valve is abnormal. No evidence of mitral valve  regurgitation. No evidence of mitral stenosis.  8. The tricuspid valve is normal in structure. Tricuspid valve  regurgitation is not demonstrated.  9. The aortic valve is tricuspid. Aortic valve regurgitation is not  visualized. Mild aortic valve stenosis.  10. The pulmonic valve was not well visualized. Pulmonic valve  regurgitation is not visualized.  11. The inferior vena cava is normal in size with greater than 50%  respiratory variability, suggesting right atrial pressure of 3 mmHg.   In comparison to the previous echocardiogram(s): Echocardiogram done  03/05/17 showed an EFof 65% with mild AS and an AV Peak Grad of 26 mmHg.   Assessment and Plan:  1. Nonrheumatic aortic (valve) stenosis   2. Bilateral carotid artery stenosis   3. Mixed hyperlipidemia   4. Essential hypertension    .1. Aortic valve stenosis, etiology of cardiac valve disease unspecified Echocardiogram in 2020 showed mild aortic valve stenosis. Marland Kitchen Has a 2-3 /6  systolic murmur best heard right upper sternal border.  She denies any lightheadedness, dizziness, near syncopal or  syncopal episodes.  Denies any chest pain.  Denies any significant dyspnea.  Please get a repeat echocardiogram to reassess valve function, LV function, diastolic function.  2. Bilateral carotid artery stenosis  Carotid Dopplers  10/23/2018 showed large amount of right-sided atherosclerotic plaque, morphologically similar to 2019 exam. 50 to 69% right ICA stenosis. No significant LICA stenosis. Previously LICA stent placed in 2007.  Has prominent bilateral carotid artery bruits.  Please get a repeat carotid artery duplex study  3. Mixed hyperlipidemia Continue Crestor 20 mg daily.  Recent labs at PCP office showed TC 124, TG 106, HDL 43, LDL 61.  4.  Essential hypertension Blood pressure well controlled today at 122/58.  Patient states she was taking losartan prescribed by Dr. Lajuana Ripple.  She shows me the bottle which states take 1/2 pill (25 mg) as needed if blood pressure greater than 140/90 and may increase dose to 50 mg daily if blood pressure continues to remain above 140/90.  Patient states she has not taken this medication in several weeks and blood pressure has remained within normal limits.  Patient states her blood pressures at home are well controlled.  She just purchased a new blood pressure cuff.  \Medication Adjustments/Labs and Tests Ordered: Current medicines are reviewed at length with the patient today.  Concerns regarding medicines are outlined above.   Disposition: Follow-up with Dr. Harl Bowie or APP 6 months Signed, Levell July, NP 04/02/2020 2:52 PM    [facility] [address], [place], [place] [address] Phone: [phone]; Fax: [phone]

## 2020-04-02 ENCOUNTER — Ambulatory Visit (INDEPENDENT_AMBULATORY_CARE_PROVIDER_SITE_OTHER): Payer: Medicare Other | Admitting: Family Medicine

## 2020-04-02 ENCOUNTER — Ambulatory Visit: Payer: Medicare Other | Admitting: Cardiology

## 2020-04-02 ENCOUNTER — Encounter: Payer: Self-pay | Admitting: Family Medicine

## 2020-04-02 ENCOUNTER — Other Ambulatory Visit: Payer: Self-pay

## 2020-04-02 VITALS — BP 122/58 | HR 88 | Ht 64.0 in | Wt 151.0 lb

## 2020-04-02 DIAGNOSIS — I6523 Occlusion and stenosis of bilateral carotid arteries: Secondary | ICD-10-CM | POA: Diagnosis not present

## 2020-04-02 DIAGNOSIS — I350 Nonrheumatic aortic (valve) stenosis: Secondary | ICD-10-CM

## 2020-04-02 DIAGNOSIS — E782 Mixed hyperlipidemia: Secondary | ICD-10-CM | POA: Diagnosis not present

## 2020-04-02 DIAGNOSIS — I10 Essential (primary) hypertension: Secondary | ICD-10-CM | POA: Diagnosis not present

## 2020-04-02 NOTE — Patient Instructions (Addendum)
Your physician recommends that you schedule a follow-up appointment in: [place] MD   Your physician recommends that you continue on your current medications as directed. Please refer to the Current Medication list given to you today.  Your physician has requested that you have an echocardiogram. Echocardiography is a painless test that uses sound waves to create images of your heart. It provides your doctor with information about the size and shape of your heart and how well your heart's chambers and valves are working. This procedure takes approximately one hour. There are no restrictions for this procedure.  Your physician has requested that you have a carotid duplex. This test is an ultrasound of the carotid arteries in your neck. It looks at blood flow through these arteries that supply the brain with blood. Allow one hour for this exam. There are no restrictions or special instructions.  Thank you for choosing [facility]!!

## 2020-04-09 ENCOUNTER — Ambulatory Visit (INDEPENDENT_AMBULATORY_CARE_PROVIDER_SITE_OTHER): Payer: Medicare Other | Admitting: Family Medicine

## 2020-04-09 ENCOUNTER — Encounter: Payer: Self-pay | Admitting: Family Medicine

## 2020-04-09 ENCOUNTER — Other Ambulatory Visit: Payer: Self-pay

## 2020-04-09 VITALS — BP 134/74 | HR 90 | Temp 97.7°F | Ht 63.5 in | Wt 146.0 lb

## 2020-04-09 DIAGNOSIS — I350 Nonrheumatic aortic (valve) stenosis: Secondary | ICD-10-CM | POA: Diagnosis not present

## 2020-04-09 DIAGNOSIS — E785 Hyperlipidemia, unspecified: Secondary | ICD-10-CM | POA: Diagnosis not present

## 2020-04-09 DIAGNOSIS — E1169 Type 2 diabetes mellitus with other specified complication: Secondary | ICD-10-CM

## 2020-04-09 DIAGNOSIS — E1159 Type 2 diabetes mellitus with other circulatory complications: Secondary | ICD-10-CM

## 2020-04-09 DIAGNOSIS — I152 Hypertension secondary to endocrine disorders: Secondary | ICD-10-CM

## 2020-04-09 LAB — BAYER DCA HB A1C WAIVED: HB A1C (BAYER DCA - WAIVED): 7.2 % — ABNORMAL HIGH (ref <7.0)

## 2020-04-09 MED ORDER — ROSUVASTATIN CALCIUM 20 MG PO TABS: 20.0000 mg | ORAL_TABLET | Freq: Every day | ORAL | 3 refills | Status: AC

## 2020-04-09 MED ORDER — METFORMIN HCL 1000 MG PO TABS: 1000.0000 mg | ORAL_TABLET | Freq: Two times a day (BID) | ORAL | 3 refills | Status: AC

## 2020-04-09 NOTE — Patient Instructions (Signed)

## 2020-04-09 NOTE — Progress Notes (Signed)
Subjective: CC: DM PCP: Emily Norlander, DO HUT:[id number] Emily Sanders is a 87 y.o. female presenting to clinic today for:  1. Type 2 Diabetes with hypertension, hyperlipidemia:  She has been out of her Janumet due to cost.  As well over $400 in December.  She is also stopped her Crestor.  She did not know that she had refills.  No reports of chest pain, shortness breath, dizziness, blurred vision, polydipsia or polyuria.  She has been actively trying to reduce bad habits and is no longer snacking late at nighttime.  Last eye exam: needs Last foot exam: needs Last A1c:  Lab Results  Component Value Date   HGBA1C 6.9 12/23/2019   Nephropathy screen indicated?: needs Last flu, zoster and/or pneumovax:  Immunization History  Administered Date(s) Administered  . Fluad Quad(high Dose 65+) 12/23/2019  . Influenza, High Dose Seasonal PF 11/21/2017  . Influenza,inj,Quad PF,6+ Mos 10/20/2016  . Moderna Sars-Covid-2 Vaccination 04/09/2019, 05/05/2019  . Pneumococcal Conjugate-13 02/22/2018  . Pneumococcal Polysaccharide-23 09/22/2019  . Zoster 08/27/2013   ROS: Per HPI  No Known Allergies Past Medical History:  Diagnosis Date  . Diabetes mellitus without complication ([place])   . Hyperlipidemia   . Hypertension   . Psoriasis     Current Outpatient Medications:  .  Blood Glucose Monitoring Suppl (BLOOD GLUCOSE MONITOR SYSTEM) w/Device KIT, 1 Device by Does not apply route 2 (two) times daily., Disp: 1 each, Rfl: 0 .  Cholecalciferol 50 MCG (2000 UT) TABS, Take 1 tablet by mouth 2 (two) times a week. , Disp: , Rfl:  .  glucose blood test strip, Check blood glucose twice daily, Disp: 100 each, Rfl: 10 .  Lancets (FREESTYLE) lancets, Test Bs twice daily Dx E11.9, Disp: 100 each, Rfl: 10 .  rosuvastatin (CRESTOR) 20 MG tablet, TAKE 1 TABLET BY MOUTH AT BEDTIME, Disp: 90 tablet, Rfl: 1 .  sitaGLIPtin-metformin (JANUMET) 50-1000 MG tablet, Take 1 tablet by mouth 2 (two) times daily with a  meal., Disp: 180 tablet, Rfl: 0 Social History   Socioeconomic History  . Marital status: Widowed    Spouse name: Not on file  . Number of children: Not on file  . Years of education: Not on file  . Highest education level: Not on file  Occupational History  . Not on file  Tobacco Use  . Smoking status: Former Research scientist (life sciences)  . Smokeless tobacco: Never Used  Vaping Use  . Vaping Use: Never used  Substance and Sexual Activity  . Alcohol use: No  . Drug use: No  . Sexual activity: Not on file  Other Topics Concern  . Not on file  Social History Narrative  . Not on file   Social Determinants of Health   Financial Resource Strain: Not on file  Food Insecurity: Not on file  Transportation Needs: Not on file  Physical Activity: Not on file  Stress: Not on file  Social Connections: Not on file  Intimate Partner Violence: Not on file   Family History  Problem Relation Age of Onset  . Diabetes Mother   . Heart disease Father     Objective: Office vital signs reviewed. BP 134/74   Pulse 90   Temp 97.7 F (36.5 C) (Temporal)   Ht 5' 3.5" (1.613 m)   Wt 146 lb (66.2 kg)   SpO2 96%   BMI 25.46 kg/m   Physical Examination:  General: Awake, alert, well nourished, No acute distress HEENT: Normal; sclera white.  Moist mucous membranes  Cardio: regular rate and rhythm, S1S2 heard, 3/6 blowing systolic murmur appreciated Pulm: clear to auscultation bilaterally, no wheezes, rhonchi or rales; normal work of breathing on room air Extremities: warm, well perfused, No edema, cyanosis or clubbing; +2 pulses bilaterally MSK: normal gait and station Skin: dry; intact; no rashes or lesions Neuro: see DM foot  Diabetic Foot Exam - Simple   Simple Foot Form Diabetic Foot exam was performed with the following findings: Yes 04/09/2020 10:14 AM  Visual Inspection See comments: Yes Sensation Testing Intact to touch and monofilament testing bilaterally: Yes Pulse Check Posterior Tibialis  and Dorsalis pulse intact bilaterally: Yes Comments .  Toeing of the second digit on the left toe noted.  She has a small callus formation appreciated here.  She has a bunion noted on the left great toe as well.  Nontender to touch.  No significant erythema or warmth.     Assessment/ Plan: 87 y.o. female   Type 2 diabetes mellitus with other specified complication, without long-term current use of insulin ([place]) - Plan: Bayer DCA Hb A1c Waived, Microalbumin / creatinine urine ratio, metFORMIN (GLUCOPHAGE) 1000 MG tablet  Hyperlipidemia associated with type 2 diabetes mellitus ([place]) - Plan: rosuvastatin (CRESTOR) 20 MG tablet  Hypertension associated with diabetes ([place])  Nonrheumatic aortic (valve) stenosis  A1c is in acceptable range for the patient.  I am going to put her back on Metformin 1000 mg twice daily.  May be able to reduce this to 500 mg twice daily pending next A1c.  She is also working on diet modification so again may be able to reduce or eliminate medicine totally.  Janumet was not affordable  Resume statin.  This is been sent  Blood pressure is at goal for age without medication.  Asymptomatic from a bowel standpoint  No orders of the defined types were placed in this encounter.  No orders of the defined types were placed in this encounter.    Emily Norlander, DO [place] [phone]

## 2020-04-10 LAB — MICROALBUMIN / CREATININE URINE RATIO
Creatinine, Urine: 112.7 mg/dL
Microalb/Creat Ratio: 13 mg/g creat (ref 0–29)
Microalbumin, Urine: 14.6 ug/mL

## 2020-04-15 ENCOUNTER — Telehealth: Payer: Self-pay

## 2020-04-15 NOTE — Telephone Encounter (Signed)
Pt called- wants to know if there was any sugar in her urine. Please call patient.

## 2020-04-15 NOTE — Telephone Encounter (Signed)
Lmtcb.

## 2020-05-06 ENCOUNTER — Other Ambulatory Visit: Payer: Self-pay

## 2020-05-06 ENCOUNTER — Ambulatory Visit (INDEPENDENT_AMBULATORY_CARE_PROVIDER_SITE_OTHER): Payer: Medicare Other

## 2020-05-06 ENCOUNTER — Ambulatory Visit: Payer: Medicare Other

## 2020-05-06 DIAGNOSIS — I350 Nonrheumatic aortic (valve) stenosis: Secondary | ICD-10-CM

## 2020-05-07 LAB — ECHOCARDIOGRAM COMPLETE
AR max vel: 1.15 cm2
AV Area VTI: 1.12 cm2
AV Area mean vel: 1.11 cm2
AV Mean grad: 15.1 mmHg
AV Peak grad: 28.2 mmHg
Ao pk vel: 2.65 m/s
S' Lateral: 3.32 cm
Single Plane A4C EF: 67 %

## 2020-05-11 ENCOUNTER — Telehealth: Payer: Self-pay | Admitting: Family Medicine

## 2020-05-11 NOTE — Telephone Encounter (Signed)
Laurine Blazer, LPN  04/05/9700 6:37 PM EDT Back to Top     Notified, copy to pcp.    Laurine Blazer, LPN  8/58/8502 7:74 PM EDT      Left message to return call.   Verta Ellen., NP  05/07/2020 1:18 PM EDT      Echocardiogram showed good pumping function of the heart. There is some moderate thickening of the left pumping chamber with some stiffness. Best management is to keep blood pressure at or below 130/80 and manage all other risk factors. She has a mildly leaking mitral valve. This is not uncommon with aging. She has some mild to moderate aortic stenosis. Tell her we will just keep an eye on the aortic stenosis by doing an echocardiogram every year.

## 2020-05-11 NOTE — Telephone Encounter (Signed)
Patient will try to call Edd Fabian back later for the results of her Echo, she would not leave a number for a call back, she is staying at a friends house.

## 2020-06-08 DIAGNOSIS — I739 Peripheral vascular disease, unspecified: Secondary | ICD-10-CM | POA: Diagnosis not present

## 2020-06-08 DIAGNOSIS — L110 Acquired keratosis follicularis: Secondary | ICD-10-CM | POA: Diagnosis not present

## 2020-06-08 DIAGNOSIS — M79671 Pain in right foot: Secondary | ICD-10-CM | POA: Diagnosis not present

## 2020-06-08 DIAGNOSIS — E1140 Type 2 diabetes mellitus with diabetic neuropathy, unspecified: Secondary | ICD-10-CM | POA: Diagnosis not present

## 2020-06-08 DIAGNOSIS — M79674 Pain in right toe(s): Secondary | ICD-10-CM | POA: Diagnosis not present

## 2020-06-08 DIAGNOSIS — M79672 Pain in left foot: Secondary | ICD-10-CM | POA: Diagnosis not present

## 2020-06-08 DIAGNOSIS — M79675 Pain in left toe(s): Secondary | ICD-10-CM | POA: Diagnosis not present

## 2020-07-07 IMAGING — US US CAROTID DUPLEX BILAT
1 series · 13 of 24 positions shown · non-contrast
Comparison: 03/09/2017

CLINICAL DATA: Carotid artery stenosis. History of left-sided
carotid endo directed (9859) as well as left-sided carotid stent
placement. History of hypertension, hyperlipidemia and diabetes.

EXAM:
BILATERAL CAROTID DUPLEX ULTRASOUND
TECHNIQUE: Gray scale imaging, color Doppler and duplex ultrasound were
performed of bilateral carotid and vertebral arteries in the neck.

[Series 1: us carotid duplex bilat · 13 of 70 slices shown]
[im 1/70]
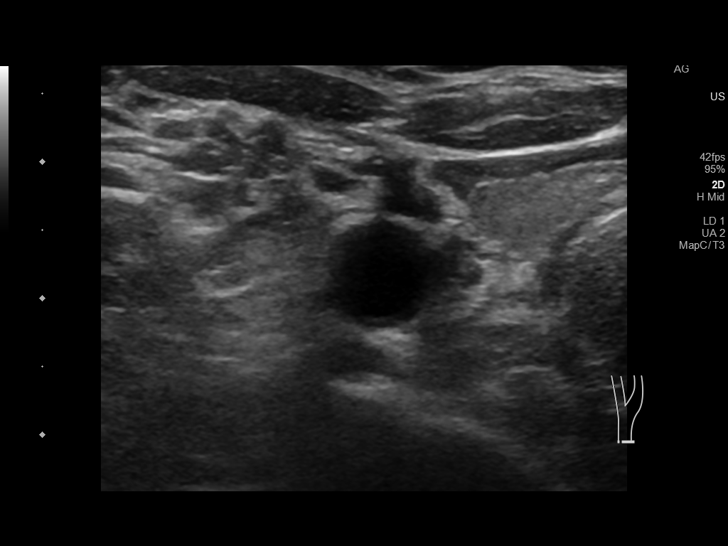
[im 7/70]
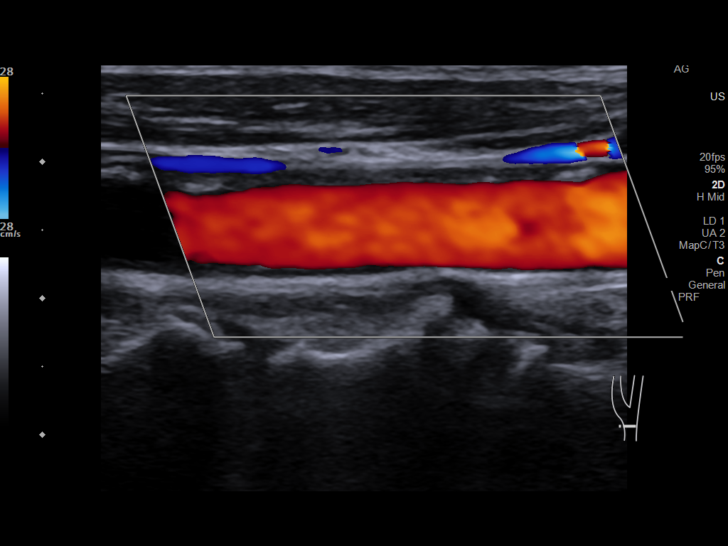
[im 13/70]
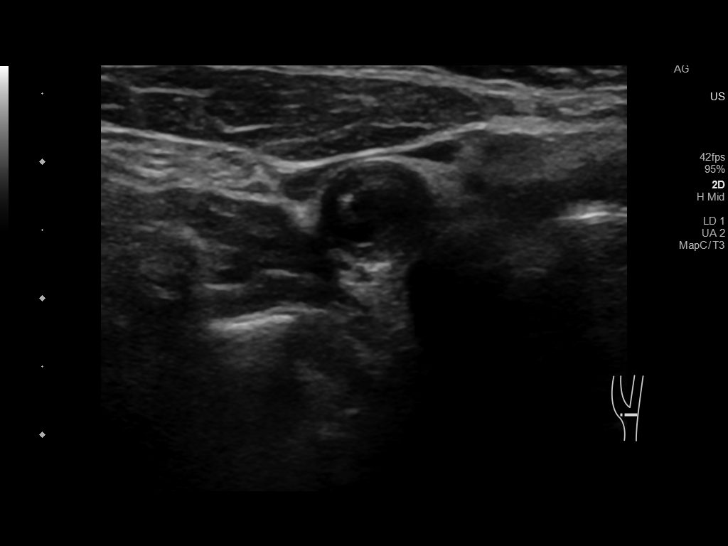
[im 19/70]
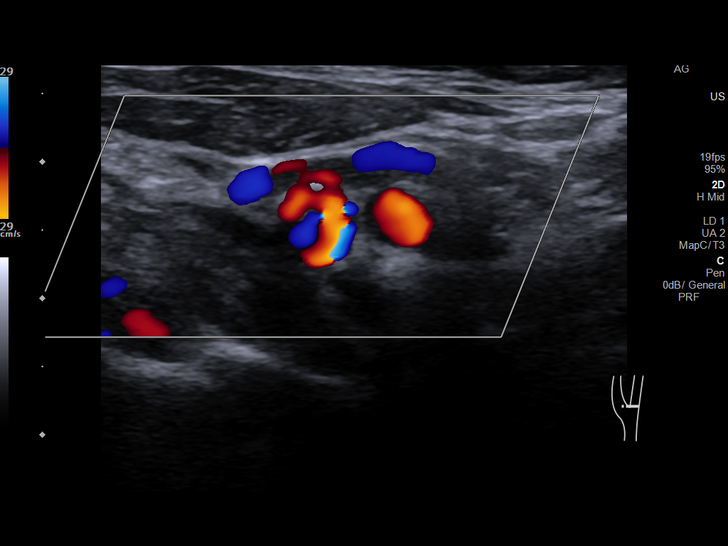
[im 25/70]
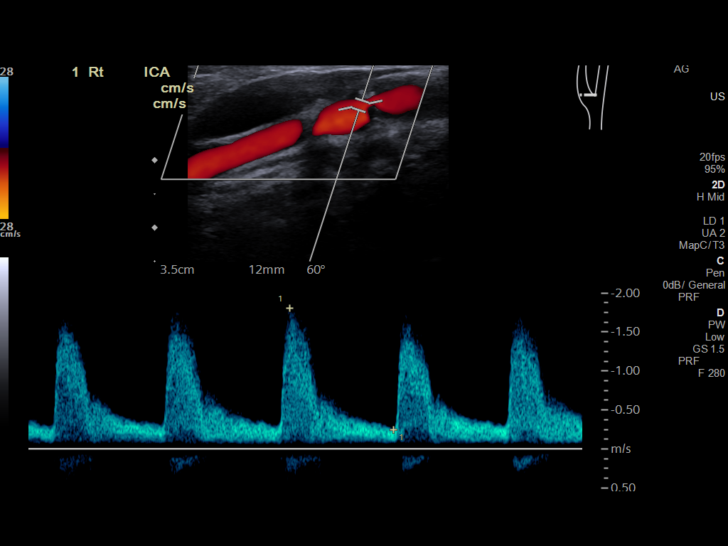
[im 31/70]
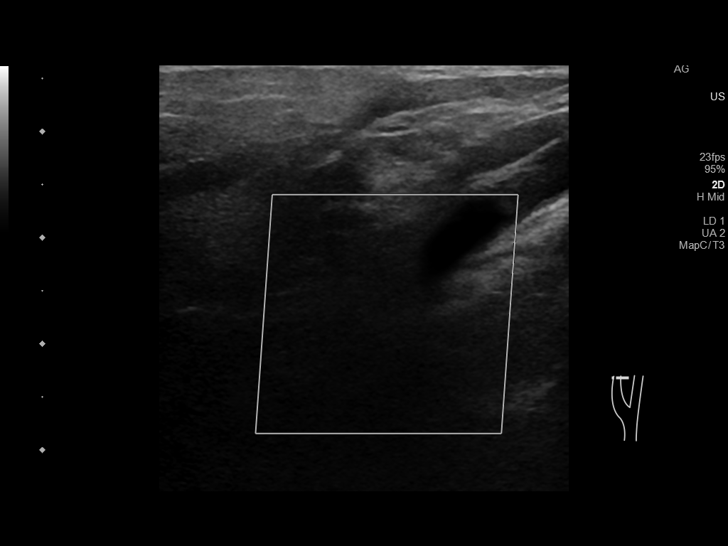
[im 37/70]
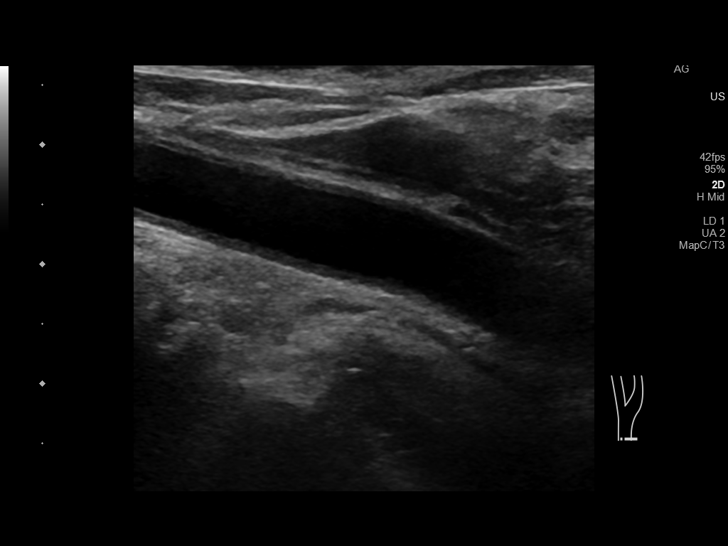
[im 40/70]
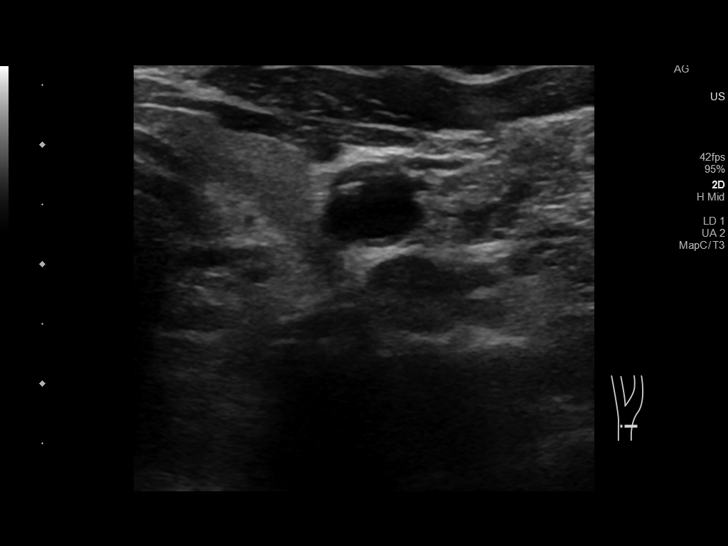
[im 46/70]
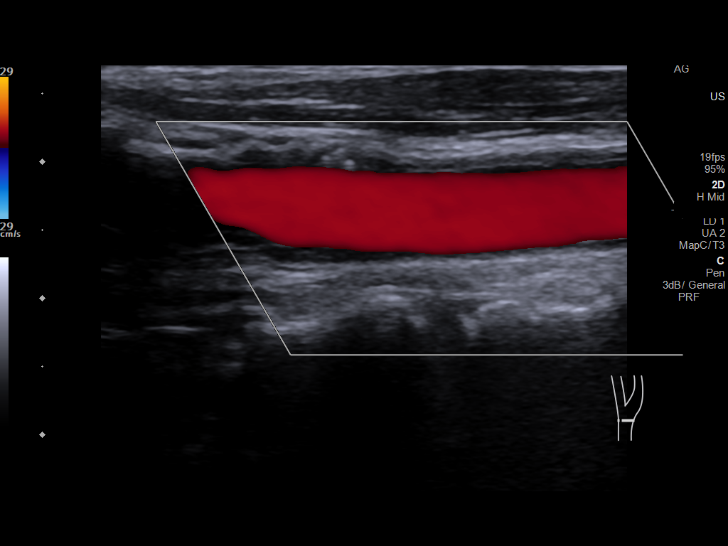
[im 52/70]
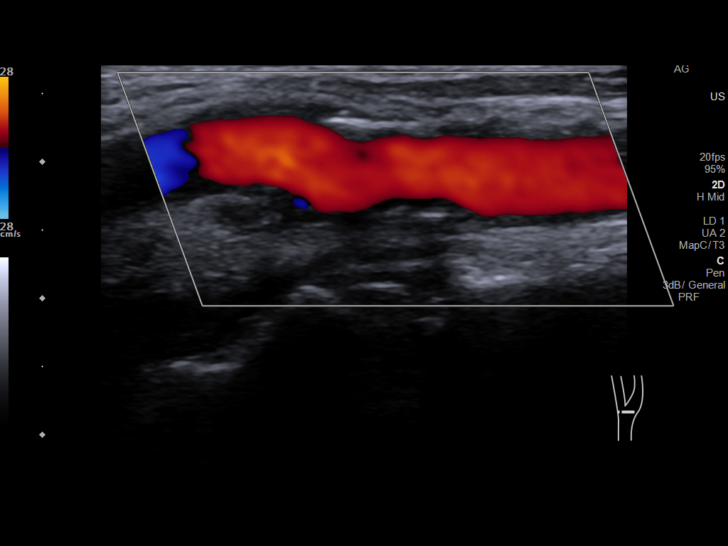
[im 58/70]
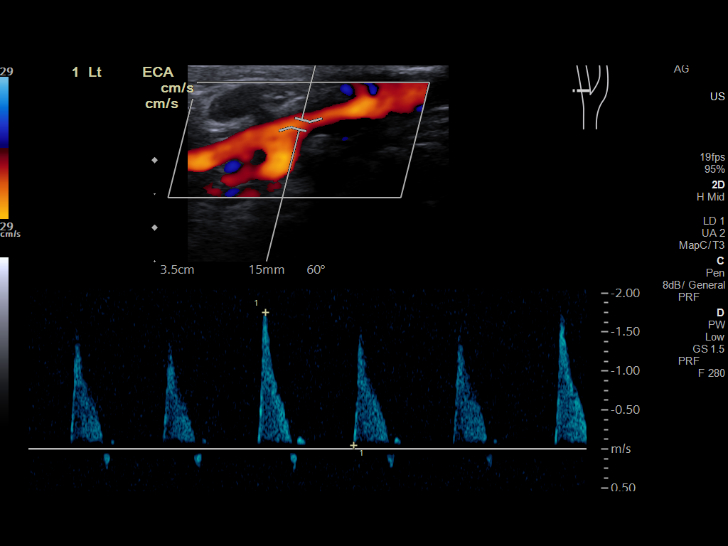
[im 64/70]
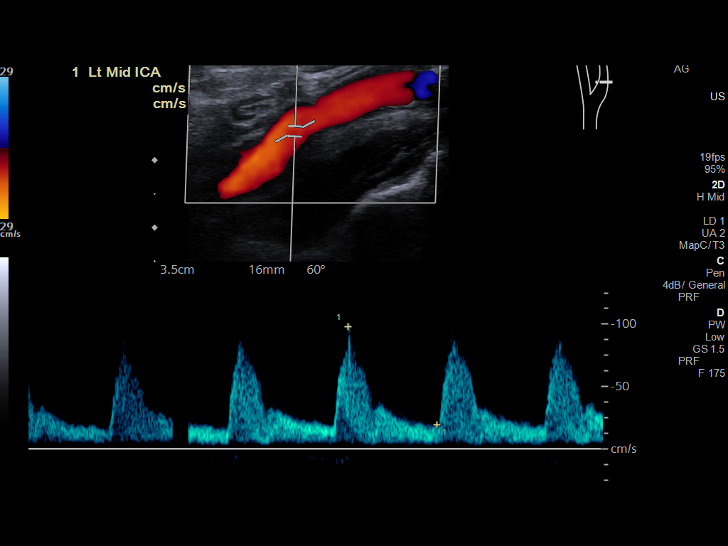
[im 70/70]
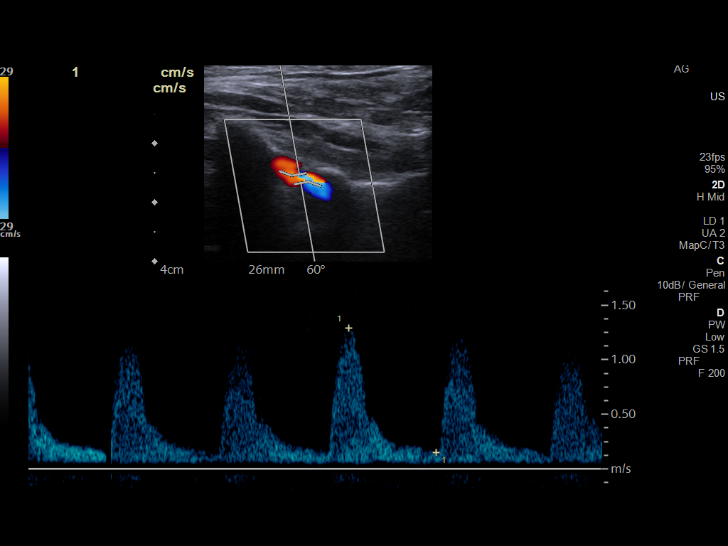

[13 of 24 positions shown; findings below may reference images not displayed]

FINDINGS: Criteria: Quantification of carotid stenosis is based on velocity
parameters that correlate the residual internal carotid diameter
with NASCET-based stenosis levels, using the diameter of the distal
internal carotid lumen as the denominator for stenosis measurement.

The following velocity measurements were obtained:

RIGHT

ICA: 185/17 cm/sec

CCA: 71/8 cm/sec

SYSTOLIC ICA/CCA RATIO:

ECA: 189 cm/sec

LEFT

ICA: 115/21 cm/sec

CCA: 88/12 cm/sec

SYSTOLIC ICA/CCA RATIO:

ECA: 175 cm/sec

RIGHT CAROTID ARTERY: There is a minimal amount of eccentric
echogenic plaque involving the mid and distal aspects of the right
common carotid artery. There is a large amount of eccentric mixed
echogenic plaque within the right carotid bulb (images 14 and 16),
extending to involve the origin and proximal aspects of the right
internal carotid artery (image 24), morphologically similar to the
[DATE] examination and again results in elevated peak systolic
velocities within the proximal and mid aspects of the right internal
carotid artery. Greatest acquired peak systolic velocity within the
proximal right ICA measures 185 centimeters/second (image 27),
previously, greatest acquired peak systolic velocity within the
right mid ICA measured 180 centimeters/second.

RIGHT VERTEBRAL ARTERY:  Antegrade Flow

LEFT CAROTID ARTERY: There is a minimal amount of eccentric mixed
echogenic plaque involving the mid and distal aspects of the left
common carotid artery. Stable postsurgical change of the left
carotid bulb with minimal residual intimal wall thickening,
extending to involve the origin and proximal aspects of the left
internal carotid artery, morphologically similar to the [DATE]
examination and again not resulting in elevated peak systolic
velocities within the interrogated course the left internal carotid
artery to suggest a hemodynamically significant stenosis.

LEFT VERTEBRAL ARTERY:  Antegrade Flow
IMPRESSION: 1. Large amount of right-sided atherosclerotic plaque,
morphologically similar to the [DATE] examination, again results in
elevated peak systolic velocities within the right internal carotid
artery compatible with higher end of the 50-69% luminal narrowing
range. Further evaluation with CTA could performed as clinically
indicated.
2. Stable postsurgical change of the left carotid bulb,
morphologically similar to the [DATE] examination, and without
evidence of a residual or recurrent hemodynamically significant
stenosis.

## 2020-07-07 IMAGING — US US SOFT TISSUE HEAD/NECK
1 series · 11 of 11 positions shown · non-contrast
Comparison: None.

CLINICAL DATA: Palpable abnormality involving the base of the
anterior aspect of the left side of the neck.

EXAM:
ULTRASOUND OF HEAD/NECK SOFT TISSUES
TECHNIQUE: Ultrasound examination of the head and neck soft tissues was
performed in the area of clinical concern.

[Series 1: us soft tissue head/neck · 11 of 11 slices shown]
[im 1/11]
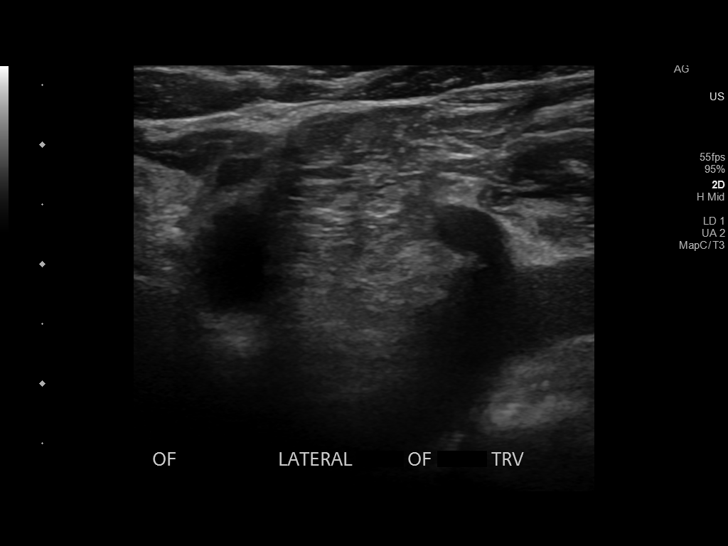
[im 2/11]
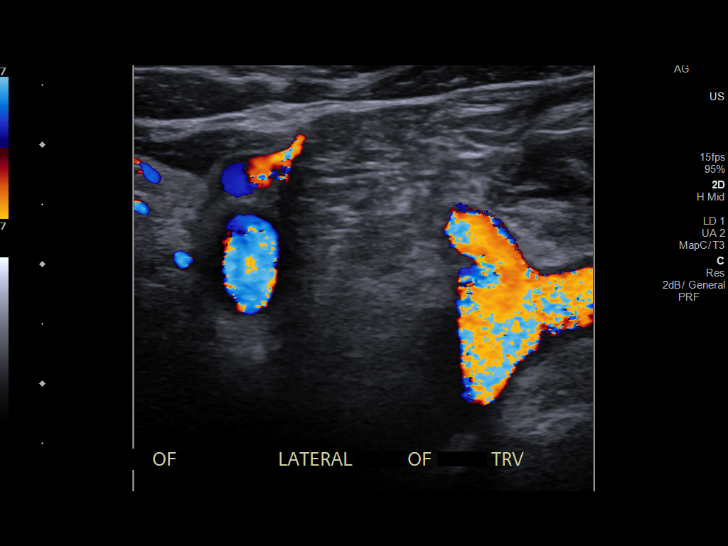
[im 3/11]
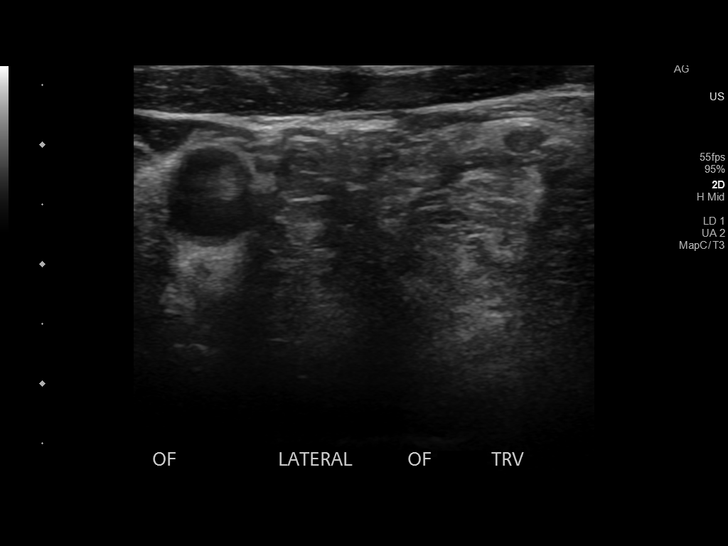
[im 4/11]
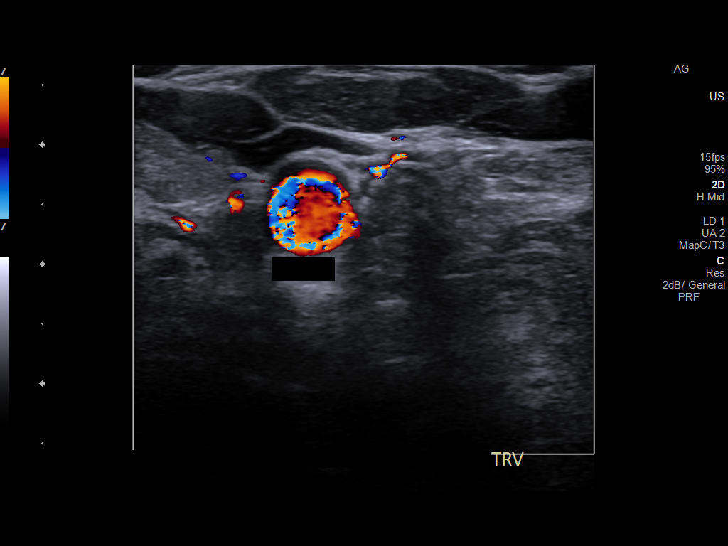
[im 5/11]
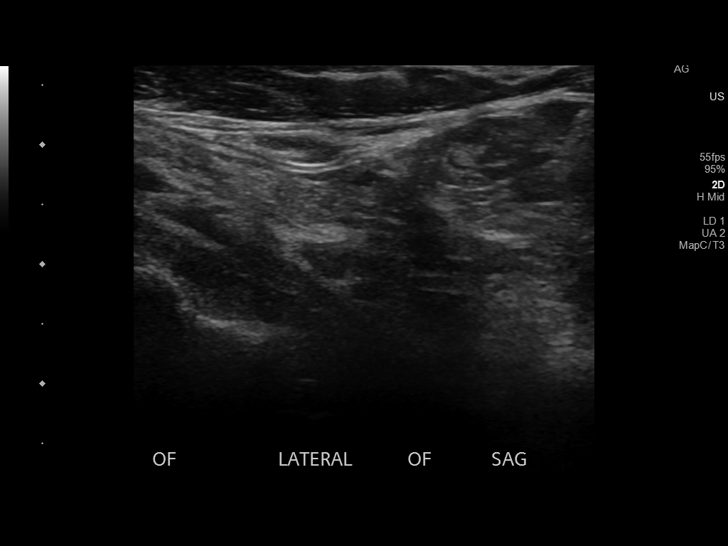
[im 6/11]
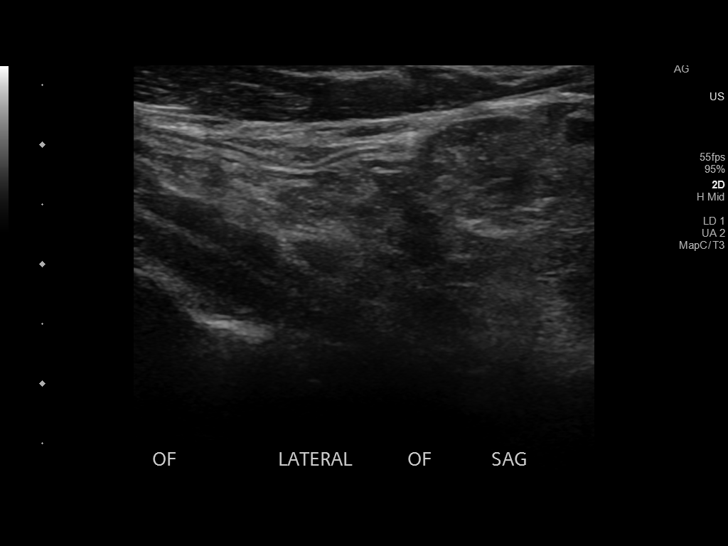
[im 7/11]
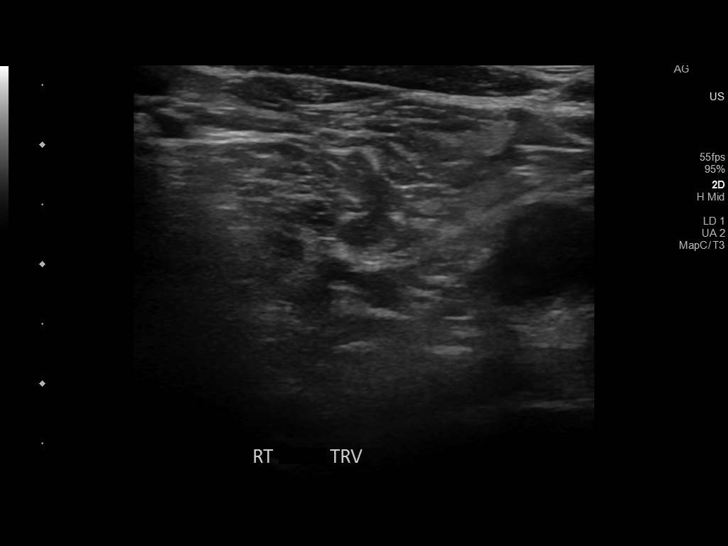
[im 8/11]
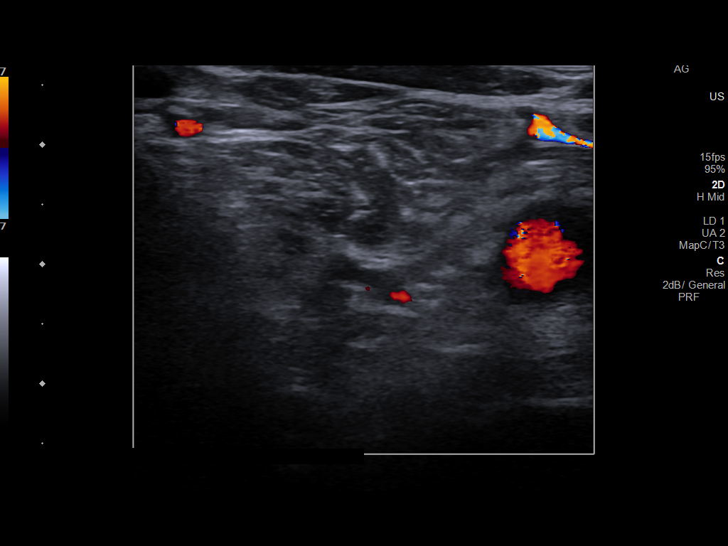
[im 9/11]
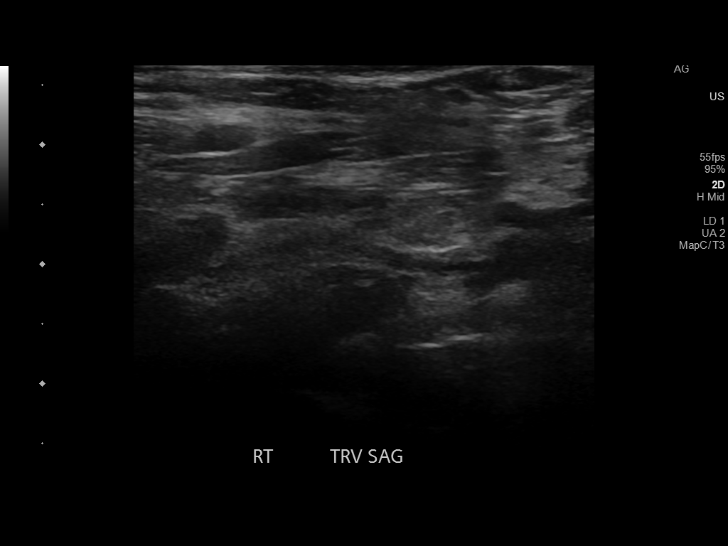
[im 10/11]
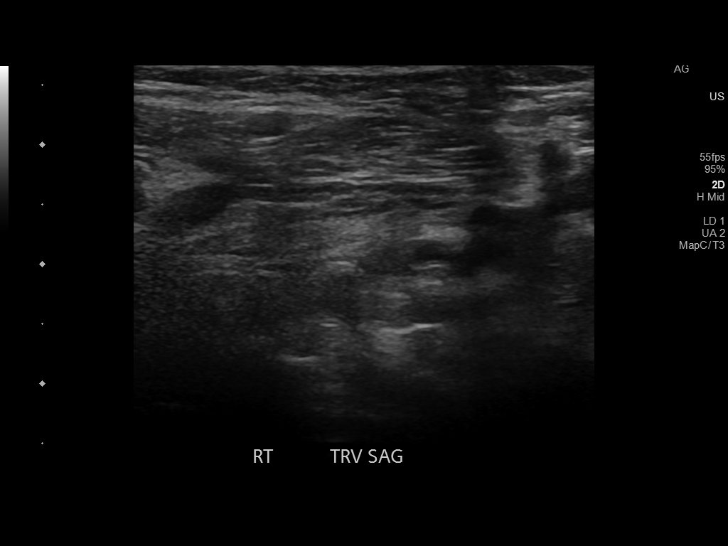
[im 11/11]
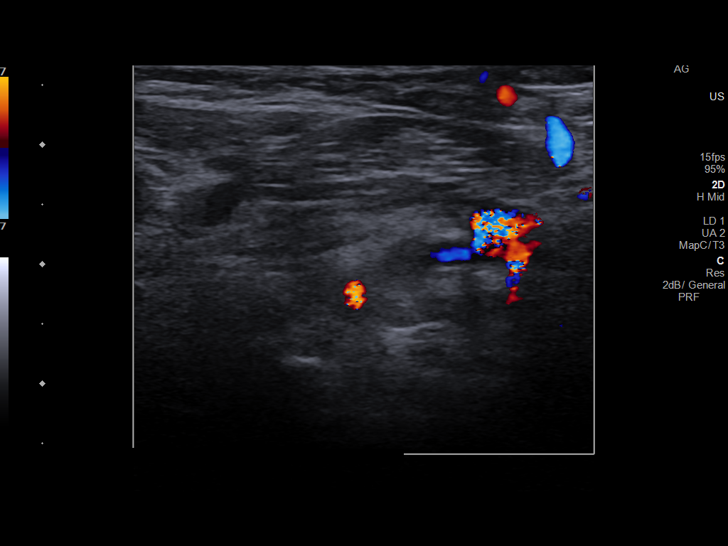

[11 of 11 positions shown; findings below may reference images not displayed]

FINDINGS: No sonographic correlate for palpable area of concern involving the
base of the anterior aspect of the left side of the neck.

Comparison images are obtained of the contralateral base of the
anterior aspect the right-side of the neck and also negative for
definitive sonographic abnormality.
IMPRESSION: No sonographic correlate for patient's palpable area of concern
involving the anterior aspect of the base of the left side of the
neck.

## 2020-07-13 ENCOUNTER — Ambulatory Visit (INDEPENDENT_AMBULATORY_CARE_PROVIDER_SITE_OTHER): Payer: Medicare Other | Admitting: Family Medicine

## 2020-07-13 DIAGNOSIS — Z5329 Procedure and treatment not carried out because of patient's decision for other reasons: Secondary | ICD-10-CM

## 2020-07-13 DIAGNOSIS — Z91199 Patient's noncompliance with other medical treatment and regimen due to unspecified reason: Secondary | ICD-10-CM

## 2020-07-13 NOTE — Progress Notes (Signed)
Tried to reach. No answer.  Attempted to reach her daughter at [phone] to see if she could do a wellness check on the patient since she did not answer her phone x3.  Unfortunately, Stanton Kidney was not available.  I tried to leave a message with her family member, who answered the phone but we were disconnected.  Start time: 10:15am (LVM); 10:19am (no answer); 10:20am (LVM)

## 2020-07-19 NOTE — Progress Notes (Signed)
I attempted to reach pt again - her number - NA - I did leave a VM asking for pt to call me back. The daughters number is incorrect - a man answered and stated that he lives there alone and that was his number. Number deleated from contact info.  We do need a emergency back up number at next [place] for daughter.

## 2020-08-06 ENCOUNTER — Ambulatory Visit (INDEPENDENT_AMBULATORY_CARE_PROVIDER_SITE_OTHER): Payer: Medicare Other | Admitting: Family Medicine

## 2020-08-06 ENCOUNTER — Encounter: Payer: Self-pay | Admitting: Family Medicine

## 2020-08-06 VITALS — BP 118/69 | HR 96 | Temp 97.4°F | Ht 63.0 in | Wt 135.6 lb

## 2020-08-06 DIAGNOSIS — L309 Dermatitis, unspecified: Secondary | ICD-10-CM | POA: Diagnosis not present

## 2020-08-06 DIAGNOSIS — R42 Dizziness and giddiness: Secondary | ICD-10-CM | POA: Diagnosis not present

## 2020-08-06 MED ORDER — MECLIZINE HCL 12.5 MG PO TABS: 12.5000 mg | ORAL_TABLET | Freq: Three times a day (TID) | ORAL | 0 refills | Status: DC | PRN

## 2020-08-06 MED ORDER — TRIAMCINOLONE ACETONIDE 0.1 % EX CREA: 1.0000 "application " | TOPICAL_CREAM | Freq: Two times a day (BID) | CUTANEOUS | 1 refills | Status: AC

## 2020-08-06 NOTE — Patient Instructions (Signed)
Vertigo Vertigo is the feeling that you or your surroundings are moving when they are not. This feeling can come and go at any time. Vertigo often goes away on its own. Vertigo can be dangerous if it occurs while you are doing something thatcould endanger yourself or others, such as driving or operating machinery. Your health care provider will do tests to try to determine the cause of your vertigo. Tests will also help your health care provider decide how best totreat your condition. Follow these instructions at home: Eating and drinking     Dehydration can make vertigo worse. Drink enough fluid to keep your urine pale yellow. Do not drink alcohol. Activity Return to your normal activities as told by your health care provider. Ask your health care provider what activities are safe for you. In the morning, first sit up on the side of the bed. When you feel okay, stand slowly while you hold onto something until you know that your balance is fine. Move slowly. Avoid sudden body or head movements or certain positions, as told by your health care provider. If you have trouble walking or keeping your balance, try using a cane for stability. If you feel dizzy or unstable, sit down right away. Avoid doing any tasks that would cause danger to you or others if vertigo occurs. Avoid bending down if you feel dizzy. Place items in your home so that they are easy for you to reach without bending or leaning over. Do not drive or use machinery if you feel dizzy. General instructions Take over-the-counter and prescription medicines only as told by your health care provider. Keep all follow-up visits. This is important. Contact a health care provider if: Your medicines do not relieve your vertigo or they make it worse. Your condition gets worse or you develop new symptoms. You have a fever. You develop nausea or vomiting, or if nausea gets worse. Your family or friends notice any behavioral changes. You  have numbness or a prickling and tingling sensation in part of your body. Get help right away if you: Are always dizzy or you faint. Develop severe headaches. Develop a stiff neck. Develop sensitivity to light. Have difficulty moving or speaking. Have weakness in your hands, arms, or legs. Have changes in your hearing or vision. These symptoms may represent a serious problem that is an emergency. Do not wait to see if the symptoms will go away. Get medical help right away. Call your local emergency services (911 in the [place] Do not drive yourself to the hospital. Summary Vertigo is the feeling that you or your surroundings are moving when they are not. Your health care provider will do tests to try to determine the cause of your vertigo. Follow instructions for home care. You may be told to avoid certain tasks, positions, or movements. Contact a health care provider if your medicines do not relieve your symptoms, or if you have a fever, nausea, vomiting, or changes in behavior. Get help right away if you have severe headaches or difficulty speaking, or you develop hearing or vision problems. This information is not intended to replace advice given to you by your health care provider. Make sure you discuss any questions you have with your healthcare provider. Document Revised: 12/10/2019 Document Reviewed: 12/10/2019 Elsevier Patient Education  2022 [employer].

## 2020-08-06 NOTE — Progress Notes (Signed)
Acute Office Visit  Subjective:    Patient ID: Emily Sanders, female    DOB: 11-24-1930, 87 y.o.   MRN: [id number]  Chief Complaint  Patient presents with   Dizziness    Been going on for a little over a week drinking a lot of water.off balance when walking . Really lighted headed.   Rash    On finger tips     HPI Patient is in today for dizziness for 1 week. It occurs with standing, head movement, or changes in position. It feels like the room is almost spinning. She feels fine when she lays down. She denies chest pain, shortness of breath, falls, confusion, focal weakness, falls, edema, palpitations, changes in vision, falls, nausea, or vomiting.   She also has a rash on her hands. Her hand are dry as well. She washes her hands frequently and uses hand sanitizer often as she works as a Building control surveyor.   Past Medical History:  Diagnosis Date   Diabetes mellitus without complication ([place])    Hyperlipidemia    Hypertension    Psoriasis     Past Surgical History:  Procedure Laterality Date   ABDOMINAL HYSTERECTOMY     APPENDECTOMY     BLADDER REPAIR     CAROTID STENT INSERTION Left    CHOLECYSTECTOMY      Family History  Problem Relation Age of Onset   Diabetes Mother    Heart disease Father     Social History   Socioeconomic History   Marital status: Widowed    Spouse name: Not on file   Number of children: Not on file   Years of education: Not on file   Highest education level: Not on file  Occupational History   Not on file  Tobacco Use   Smoking status: Former   Smokeless tobacco: Never  Vaping Use   Vaping Use: Never used  Substance and Sexual Activity   Alcohol use: No   Drug use: No   Sexual activity: Not on file  Other Topics Concern   Not on file  Social History Narrative   Not on file   Social Determinants of Health   Financial Resource Strain: Not on file  Food Insecurity: Not on file  Transportation Needs: Not on file  Physical  Activity: Not on file  Stress: Not on file  Social Connections: Not on file  Intimate Partner Violence: Not on file    Outpatient Medications Prior to Visit  Medication Sig Dispense Refill   aspirin EC 81 MG tablet Take 81 mg by mouth daily. Swallow whole.     Blood Glucose Monitoring Suppl (BLOOD GLUCOSE MONITOR SYSTEM) w/Device KIT 1 Device by Does not apply route 2 (two) times daily. 1 each 0   glucose blood test strip Check blood glucose twice daily 100 each 10   Lancets (FREESTYLE) lancets Test Bs twice daily Dx E11.9 100 each 10   metFORMIN (GLUCOPHAGE) 1000 MG tablet Take 1 tablet (1,000 mg total) by mouth 2 (two) times daily with a meal. 180 tablet 3   rosuvastatin (CRESTOR) 20 MG tablet Take 1 tablet (20 mg total) by mouth at bedtime. 90 tablet 3   Cholecalciferol 50 MCG (2000 UT) TABS Take 1 tablet by mouth 2 (two) times a week.      No facility-administered medications prior to visit.    No Known Allergies  Review of Systems As per HPI.     Objective:    Physical Exam Vitals and  nursing note reviewed.  Constitutional:      General: She is not in acute distress.    Appearance: She is not ill-appearing or diaphoretic.  HENT:     Head: Normocephalic and atraumatic.     Right Ear: Tympanic membrane, ear canal and external ear normal.     Left Ear: Tympanic membrane, ear canal and external ear normal.     Nose: Nose normal.     Mouth/Throat:     Mouth: Mucous membranes are moist.     Pharynx: Oropharynx is clear.  Eyes:     Extraocular Movements: Extraocular movements intact.     Pupils: Pupils are equal, round, and reactive to light.  Cardiovascular:     Rate and Rhythm: Normal rate and regular rhythm.     Heart sounds: Normal heart sounds. No murmur heard. Pulmonary:     Effort: Pulmonary effort is normal. No respiratory distress.     Breath sounds: Normal breath sounds.  Musculoskeletal:     Cervical back: No rigidity.     Right lower leg: No edema.      Left lower leg: No edema.  Skin:    General: Skin is warm and dry.     Findings: Rash (rash consist with eczema to hand bilaterally) present.  Neurological:     General: No focal deficit present.     Mental Status: She is alert and oriented to person, place, and time.     Motor: No weakness.     Gait: Gait normal.  Psychiatric:        Behavior: Behavior normal.    BP 118/69   Pulse 96   Temp (!) 97.4 F (36.3 C) (Temporal)   Ht '5\' 3"'  (1.6 m)   Wt 135 lb 9.6 oz (61.5 kg)   SpO2 96%   BMI 24.02 kg/m  Wt Readings from Last 3 Encounters:  08/06/20 135 lb 9.6 oz (61.5 kg)  04/09/20 146 lb (66.2 kg)  04/02/20 151 lb (68.5 kg)    Health Maintenance Due  Topic Date Due   OPHTHALMOLOGY EXAM  Never done   Zoster Vaccines- Shingrix (1 of 2) Never done   COVID-19 Vaccine (3 - Booster for Moderna series) 10/05/2019    There are no preventive care reminders to display for this patient.   Lab Results  Component Value Date   TSH 4.100 09/22/2019   Lab Results  Component Value Date   WBC 9.5 09/22/2019   HGB 13.6 09/22/2019   HCT 41.1 09/22/2019   MCV 87 09/22/2019   PLT 252 09/22/2019   Lab Results  Component Value Date   NA 139 09/22/2019   K 3.9 09/22/2019   CO2 22 09/22/2019   GLUCOSE 137 (H) 09/22/2019   BUN 18 09/22/2019   CREATININE 0.83 09/22/2019   BILITOT 1.4 (H) 09/22/2019   ALKPHOS 74 09/22/2019   AST 13 09/22/2019   ALT 9 09/22/2019   PROT 7.1 09/22/2019   ALBUMIN 4.2 09/22/2019   CALCIUM 9.2 09/22/2019   Lab Results  Component Value Date   CHOL 124 09/22/2019   Lab Results  Component Value Date   HDL 43 09/22/2019   Lab Results  Component Value Date   LDLCALC 61 09/22/2019   Lab Results  Component Value Date   TRIG 106 09/22/2019   Lab Results  Component Value Date   CHOLHDL 2.9 09/22/2019   Lab Results  Component Value Date   HGBA1C 7.2 (H) 04/09/2020  Assessment & Plan:   Emily Sanders was seen today for dizziness and  rash.  Diagnoses and all orders for this visit:  Vertigo Try meclizine as below. Stay well hydrated, eat frequently, move slowly. Handout given.  -     meclizine (ANTIVERT) 12.5 MG tablet; Take 1 tablet (12.5 mg total) by mouth 3 (three) times daily as needed for dizziness.  Hand eczema Try kenalog as below.  -     triamcinolone cream (KENALOG) 0.1 %; Apply 1 application topically 2 (two) times daily.   Return to office for new or worsening symptoms, or if symptoms persist.   The patient indicates understanding of these issues and agrees with the plan.   Gwenlyn Perking, FNP

## 2020-08-11 ENCOUNTER — Encounter: Payer: Self-pay | Admitting: Family Medicine

## 2020-08-11 ENCOUNTER — Other Ambulatory Visit: Payer: Self-pay

## 2020-08-11 ENCOUNTER — Ambulatory Visit (INDEPENDENT_AMBULATORY_CARE_PROVIDER_SITE_OTHER): Payer: Medicare Other | Admitting: Family Medicine

## 2020-08-11 VITALS — BP 146/78 | HR 86 | Temp 97.1°F | Ht 63.0 in | Wt 136.6 lb

## 2020-08-11 DIAGNOSIS — R42 Dizziness and giddiness: Secondary | ICD-10-CM | POA: Diagnosis not present

## 2020-08-11 DIAGNOSIS — I152 Hypertension secondary to endocrine disorders: Secondary | ICD-10-CM

## 2020-08-11 DIAGNOSIS — E1159 Type 2 diabetes mellitus with other circulatory complications: Secondary | ICD-10-CM | POA: Diagnosis not present

## 2020-08-11 DIAGNOSIS — R2689 Other abnormalities of gait and mobility: Secondary | ICD-10-CM

## 2020-08-11 DIAGNOSIS — I6523 Occlusion and stenosis of bilateral carotid arteries: Secondary | ICD-10-CM | POA: Diagnosis not present

## 2020-08-11 DIAGNOSIS — G4459 Other complicated headache syndrome: Secondary | ICD-10-CM | POA: Diagnosis not present

## 2020-08-11 DIAGNOSIS — R41844 Frontal lobe and executive function deficit: Secondary | ICD-10-CM

## 2020-08-11 DIAGNOSIS — E1169 Type 2 diabetes mellitus with other specified complication: Secondary | ICD-10-CM | POA: Diagnosis not present

## 2020-08-11 DIAGNOSIS — R29898 Other symptoms and signs involving the musculoskeletal system: Secondary | ICD-10-CM

## 2020-08-11 LAB — URINALYSIS
Bilirubin, UA: NEGATIVE
Glucose, UA: NEGATIVE
Ketones, UA: NEGATIVE
Leukocytes,UA: NEGATIVE
Nitrite, UA: NEGATIVE
Protein,UA: NEGATIVE
RBC, UA: NEGATIVE
Specific Gravity, UA: 1.01 (ref 1.005–1.030)
Urobilinogen, Ur: 0.2 mg/dL (ref 0.2–1.0)
pH, UA: 6.5 (ref 5.0–7.5)

## 2020-08-11 MED ORDER — LORAZEPAM 0.5 MG PO TABS: 0.5000 mg | ORAL_TABLET | Freq: Two times a day (BID) | ORAL | 1 refills | Status: DC | PRN

## 2020-08-11 MED ORDER — LORAZEPAM 0.5 MG PO TABS: 0.5000 mg | ORAL_TABLET | Freq: Two times a day (BID) | ORAL | 1 refills | Status: AC | PRN

## 2020-08-11 NOTE — Progress Notes (Signed)
Subjective:  Patient ID: Emily Sanders, female    DOB: 12-03-1930  Age: 87 y.o. MRN: [id number]  CC: Balance issues   HPI Emily Sanders presents for sudden onset of dizziness starting about 10 days ago.  There was no acute event noted she just started feeling off balance.  She is also felt weak but nonfocal.  She has had some confusion according to her daughter who is with her and helps with the history.  The patient is alert and denies any memory loss or confusion.  She had headaches at first that have resolved currently.  She describes the dizziness as off-balance but has not had room spinning.  Although she denies focal neurologic changes, she has not had a limp in the past but 1 is noticed today on exam.    Depression screen [facility] 2/9 08/11/2020 08/11/2020 08/06/2020  Decreased Interest 3 0 3  Down, Depressed, Hopeless 2 0 3  PHQ - 2 Score 5 0 6  Altered sleeping 2 - 3  Tired, decreased energy 3 - 3  Change in appetite 2 - 3  Feeling bad or failure about yourself  1 - 0  Trouble concentrating 2 - 0  Moving slowly or fidgety/restless 1 - 3  Suicidal thoughts 0 - 0  PHQ-9 Score 16 - 18  Difficult doing work/chores Very difficult - Somewhat difficult  Some recent data might be hidden    History Emily Sanders has a past medical history of Diabetes mellitus without complication ([place]), Hyperlipidemia, Hypertension, and Psoriasis.   She has a past surgical history that includes Abdominal hysterectomy; Cholecystectomy; Bladder repair; Appendectomy; and Carotid stent insertion (Left).   Her family history includes Diabetes in her mother; Heart disease in her father.She reports that she has quit smoking. She has never used smokeless tobacco. She reports that she does not drink alcohol and does not use drugs.    ROS Review of Systems  Constitutional: Negative.   HENT:  Negative for congestion.   Eyes:  Negative for visual disturbance.  Respiratory:  Positive for shortness of  breath.   Cardiovascular:  Negative for chest pain.  Gastrointestinal:  Negative for abdominal pain, constipation, diarrhea, nausea and vomiting.  Genitourinary:  Negative for difficulty urinating.  Musculoskeletal:  Negative for arthralgias, gait problem and myalgias.  Neurological:  Positive for dizziness. Negative for speech difficulty and headaches.  Psychiatric/Behavioral:  Positive for confusion. Negative for sleep disturbance.    Objective:  BP (!) 146/78   Pulse 86   Temp (!) 97.1 F (36.2 C)   Ht _0  (1.6 m)   Wt 136 lb 9.6 oz (62 kg)   SpO2 98%   BMI 24.20 kg/m   BP Readings from Last 3 Encounters:  08/11/20 (!) 146/78  08/06/20 118/69  04/09/20 134/74    Wt Readings from Last 3 Encounters:  08/11/20 136 lb 9.6 oz (62 kg)  08/06/20 135 lb 9.6 oz (61.5 kg)  04/09/20 146 lb (66.2 kg)     Physical Exam Constitutional:      General: She is not in acute distress.    Appearance: She is well-developed.  HENT:     Head: Normocephalic and atraumatic.  Eyes:     Conjunctiva/sclera: Conjunctivae normal.     Pupils: Pupils are equal, round, and reactive to light.  Neck:     Thyroid: No thyromegaly.  Cardiovascular:     Rate and Rhythm: Normal rate and regular rhythm.     Heart sounds: Normal heart  sounds. No murmur heard. Pulmonary:     Effort: Pulmonary effort is normal. No respiratory distress.     Breath sounds: Normal breath sounds. No wheezing or rales.  Abdominal:     General: Bowel sounds are normal. There is no distension.     Palpations: Abdomen is soft.     Tenderness: There is no abdominal tenderness.  Musculoskeletal:        General: Deformity (Favoring the left leg, limping slightly) present. Normal range of motion.     Cervical back: Normal range of motion and neck supple.  Lymphadenopathy:     Cervical: No cervical adenopathy.  Skin:    General: Skin is warm and dry.  Neurological:     Mental Status: She is alert and oriented to person,  place, and time.  Psychiatric:        Behavior: Behavior normal.        Thought Content: Thought content normal.        Judgment: Judgment normal.      Assessment & Plan:   Emily Sanders was seen today for balance issues.  Diagnoses and all orders for this visit:  Loss of balance -     CMP14+EGFR -     Urinalysis -     TSH -     MR BRAIN W WO CONTRAST; Future -     MR Angiogram Head Wo Contrast; Future -     Sedimentation rate -     Discontinue: LORazepam (ATIVAN) 0.5 MG tablet; Take 1 tablet (0.5 mg total) by mouth 2 (two) times daily as needed for anxiety. -     Ambulatory referral to Neurology -     DME Wheelchair manual -     AMB Referral to Community Care Coordinaton -     LORazepam (ATIVAN) 0.5 MG tablet; Take 1 tablet (0.5 mg total) by mouth 2 (two) times daily as needed for anxiety.  Weakness of left lower extremity -     CMP14+EGFR -     Urinalysis -     TSH -     MR BRAIN W WO CONTRAST; Future -     MR Angiogram Head Wo Contrast; Future -     Sedimentation rate -     Discontinue: LORazepam (ATIVAN) 0.5 MG tablet; Take 1 tablet (0.5 mg total) by mouth 2 (two) times daily as needed for anxiety. -     Ambulatory referral to Neurology -     DME Wheelchair manual -     AMB Referral to Community Care Coordinaton -     LORazepam (ATIVAN) 0.5 MG tablet; Take 1 tablet (0.5 mg total) by mouth 2 (two) times daily as needed for anxiety.  Dizziness -     CMP14+EGFR -     Urinalysis -     TSH -     MR BRAIN W WO CONTRAST; Future -     MR Angiogram Head Wo Contrast; Future -     Sedimentation rate -     Discontinue: LORazepam (ATIVAN) 0.5 MG tablet; Take 1 tablet (0.5 mg total) by mouth 2 (two) times daily as needed for anxiety. -     Ambulatory referral to Neurology -     DME Wheelchair manual -     AMB Referral to Community Care Coordinaton -     LORazepam (ATIVAN) 0.5 MG tablet; Take 1 tablet (0.5 mg total) by mouth 2 (two) times daily as needed for anxiety.  Type 2  diabetes mellitus with  other specified complication, without long-term current use of insulin (HCC) -     CMP14+EGFR -     Urinalysis -     TSH -     MR BRAIN W WO CONTRAST; Future -     MR Angiogram Head Wo Contrast; Future -     Sedimentation rate -     Discontinue: LORazepam (ATIVAN) 0.5 MG tablet; Take 1 tablet (0.5 mg total) by mouth 2 (two) times daily as needed for anxiety. -     Ambulatory referral to Neurology -     DME Wheelchair manual -     AMB Referral to Community Care Coordinaton -     LORazepam (ATIVAN) 0.5 MG tablet; Take 1 tablet (0.5 mg total) by mouth 2 (two) times daily as needed for anxiety.  Hypertension associated with diabetes ([place]) -     CMP14+EGFR -     Urinalysis -     TSH -     MR BRAIN W WO CONTRAST; Future -     MR Angiogram Head Wo Contrast; Future -     Sedimentation rate -     Discontinue: LORazepam (ATIVAN) 0.5 MG tablet; Take 1 tablet (0.5 mg total) by mouth 2 (two) times daily as needed for anxiety. -     Ambulatory referral to Neurology -     DME Wheelchair manual -     AMB Referral to Community Care Coordinaton -     LORazepam (ATIVAN) 0.5 MG tablet; Take 1 tablet (0.5 mg total) by mouth 2 (two) times daily as needed for anxiety.  Other complicated headache syndrome -     CMP14+EGFR -     Urinalysis -     TSH -     MR BRAIN W WO CONTRAST; Future -     MR Angiogram Head Wo Contrast; Future -     Sedimentation rate -     Discontinue: LORazepam (ATIVAN) 0.5 MG tablet; Take 1 tablet (0.5 mg total) by mouth 2 (two) times daily as needed for anxiety. -     Ambulatory referral to Neurology -     DME Wheelchair manual -     AMB Referral to Community Care Coordinaton -     LORazepam (ATIVAN) 0.5 MG tablet; Take 1 tablet (0.5 mg total) by mouth 2 (two) times daily as needed for anxiety.  Frontal lobe and executive function deficit  -     MR Angiogram Head Wo Contrast; Future -     DME Wheelchair manual -     AMB Referral to [place]      I am having Emily Sanders maintain her Blood Glucose Monitor System, glucose blood, freestyle, metFORMIN, rosuvastatin, aspirin EC, meclizine, triamcinolone cream, and LORazepam.  Allergies as of 08/11/2020   No Known Allergies      Medication List        Accurate as of August 11, 2020  6:28 PM. If you have any questions, ask your nurse or doctor.          aspirin EC 81 MG tablet Take 81 mg by mouth daily. Swallow whole.   Blood Glucose Monitor System w/Device Kit 1 Device by Does not apply route 2 (two) times daily.   freestyle lancets Test Bs twice daily Dx E11.9   glucose blood test strip Check blood glucose twice daily   LORazepam 0.5 MG tablet Commonly known as: ATIVAN Take 1 tablet (0.5 mg total) by mouth 2 (two) times daily  as needed for anxiety. Started by: Claretta Fraise, MD   meclizine 12.5 MG tablet Commonly known as: ANTIVERT Take 1 tablet (12.5 mg total) by mouth 3 (three) times daily as needed for dizziness.   metFORMIN 1000 MG tablet Commonly known as: GLUCOPHAGE Take 1 tablet (1,000 mg total) by mouth 2 (two) times daily with a meal.   rosuvastatin 20 MG tablet Commonly known as: CRESTOR Take 1 tablet (20 mg total) by mouth at bedtime.   triamcinolone cream 0.1 % Commonly known as: KENALOG Apply 1 application topically 2 (two) times daily.               Durable Medical Equipment  (From admission, onward)           Start     Ordered   08/11/20 0000  DME Wheelchair manual       Comments: Patient suffers from imbalance which impairs their ability to perform daily activities like bathing, grooming, and toileting in the home.  A cane or walker will not resolve issue with performing activities of daily living. A wheelchair will allow patient to safely perform daily activities. Patient can safely propel the wheelchair in the home or has a caregiver who can provide assistance. Length of need Lifetime. Accessories:  elevating leg rests (ELRs), wheel locks, extensions and anti-tippers.   08/11/20 0908             Follow-up: No follow-ups on file.  Claretta Fraise, M.D.

## 2020-08-12 ENCOUNTER — Telehealth: Payer: Self-pay

## 2020-08-12 LAB — CMP14+EGFR
ALT: 15 IU/L (ref 0–32)
AST: 29 IU/L (ref 0–40)
Albumin/Globulin Ratio: 1.4 (ref 1.2–2.2)
Albumin: 4.4 g/dL (ref 3.6–4.6)
Alkaline Phosphatase: 70 IU/L (ref 44–121)
BUN/Creatinine Ratio: 23 (ref 12–28)
BUN: 16 mg/dL (ref 8–27)
Bilirubin Total: 1 mg/dL (ref 0.0–1.2)
CO2: 28 mmol/L (ref 20–29)
Calcium: 9.6 mg/dL (ref 8.7–10.3)
Chloride: 99 mmol/L (ref 96–106)
Creatinine, Ser: 0.71 mg/dL (ref 0.57–1.00)
Globulin, Total: 3.1 g/dL (ref 1.5–4.5)
Glucose: 144 mg/dL — ABNORMAL HIGH (ref 65–99)
Potassium: 4.7 mmol/L (ref 3.5–5.2)
Sodium: 141 mmol/L (ref 134–144)
Total Protein: 7.5 g/dL (ref 6.0–8.5)
eGFR: 82 mL/min/{1.73_m2} (ref >59)

## 2020-08-12 LAB — SEDIMENTATION RATE: Sed Rate: 7 mm/hr (ref 0–40)

## 2020-08-12 LAB — TSH: TSH: 3.73 u[IU]/mL (ref 0.450–4.500)

## 2020-08-12 NOTE — Progress Notes (Signed)
Hello Koral,  Your lab result is normal and/or stable.Some minor variations that are not significant are commonly marked abnormal, but do not represent any medical problem for you.  Best regards, Claretta Fraise, M.D.

## 2020-08-12 NOTE — Chronic Care Management (AMB) (Signed)
  Chronic Care Management   Note  08/12/2020 Name: Emily Sanders MRN: [id number] DOB: 12/16/1930  Emily Sanders is a 87 y.o. year old female who is a primary care patient of Janora Norlander, DO. I reached out to Revonda Humphrey by phone today in response to a referral sent by Emily Sanders's PCP, Janora Norlander, DO      Emily Sanders was given information about Chronic Care Management services today including:  CCM service includes personalized support from designated clinical staff supervised by her physician, including individualized plan of care and coordination with other care providers 24/7 contact phone numbers for assistance for urgent and routine care needs. Service will only be billed when office clinical staff spend 20 minutes or more in a month to coordinate care. Only one practitioner may furnish and bill the service in a calendar month. The patient may stop CCM services at any time (effective at the end of the month) by phone call to the office staff. The patient will be responsible for cost sharing (co-pay) of up to 20% of the service fee (after annual deductible is met).  Patient's daughter Emily Sanders agreed to services and verbal consent obtained.   Follow up plan: Telephone appointment with care management team member scheduled for:08/13/2020  Emily Sanders, Emily Sanders, Emily Sanders, Emily Sanders [id number] Direct Dial: [phone] [email or web address] Website: [email or web address]

## 2020-08-13 ENCOUNTER — Ambulatory Visit (INDEPENDENT_AMBULATORY_CARE_PROVIDER_SITE_OTHER): Payer: Medicare Other | Admitting: Licensed Clinical Social Worker

## 2020-08-13 DIAGNOSIS — L409 Psoriasis, unspecified: Secondary | ICD-10-CM

## 2020-08-13 DIAGNOSIS — R42 Dizziness and giddiness: Secondary | ICD-10-CM

## 2020-08-13 DIAGNOSIS — E1169 Type 2 diabetes mellitus with other specified complication: Secondary | ICD-10-CM

## 2020-08-13 DIAGNOSIS — E785 Hyperlipidemia, unspecified: Secondary | ICD-10-CM

## 2020-08-13 DIAGNOSIS — E559 Vitamin D deficiency, unspecified: Secondary | ICD-10-CM

## 2020-08-13 NOTE — Chronic Care Management (AMB) (Signed)
Chronic Care Management    Clinical Social Work Note  08/13/2020 Name: Emily Sanders MRN: [id number] DOB: 1930/05/27  Emily Sanders is a 87 y.o. year old female who is a primary care patient of Janora Norlander, DO. The CCM team was consulted to assist the patient with chronic disease management and/or care coordination needs related to: [employer] .   Engaged with patient by telephone for initial visit in response to provider referral for social work chronic care management and care coordination services.   Consent to Services:  The patient was given the following information about Chronic Care Management services today, agreed to services, and gave verbal consent: 1. CCM service includes personalized support from designated clinical staff supervised by the primary care provider, including individualized plan of care and coordination with other care providers 2. 24/7 contact phone numbers for assistance for urgent and routine care needs. 3. Service will only be billed when office clinical staff spend 20 minutes or more in a month to coordinate care. 4. Only one practitioner may furnish and bill the service in a calendar month. 5.The patient may stop CCM services at any time (effective at the end of the month) by phone call to the office staff. 6. The patient will be responsible for cost sharing (co-pay) of up to 20% of the service fee (after annual deductible is met). Patient agreed to services and consent obtained.  Patient agreed to services and consent obtained.   Assessment: Review of patient past medical history, allergies, medications, and health status, including review of relevant consultants reports was performed today as part of a comprehensive evaluation and provision of chronic care management and care coordination services.     SDOH (Social Determinants of Health) assessments and interventions performed:  SDOH Interventions    Flowsheet Row Most Recent Value   SDOH Interventions   Depression Interventions/Treatment  Counseling        Advanced Directives Status: See Vynca application for related entries.  CCM Care Plan  No Known Allergies  Outpatient Encounter Medications as of 08/13/2020  Medication Sig Note   aspirin EC 81 MG tablet Take 81 mg by mouth daily. Swallow whole. 08/06/2020: Maybe 2-3 times a week    Blood Glucose Monitoring Suppl (BLOOD GLUCOSE MONITOR SYSTEM) w/Device KIT 1 Device by Does not apply route 2 (two) times daily.    glucose blood test strip Check blood glucose twice daily    Lancets (FREESTYLE) lancets Test Bs twice daily Dx E11.9    LORazepam (ATIVAN) 0.5 MG tablet Take 1 tablet (0.5 mg total) by mouth 2 (two) times daily as needed for anxiety.    meclizine (ANTIVERT) 12.5 MG tablet Take 1 tablet (12.5 mg total) by mouth 3 (three) times daily as needed for dizziness.    metFORMIN (GLUCOPHAGE) 1000 MG tablet Take 1 tablet (1,000 mg total) by mouth 2 (two) times daily with a meal.    rosuvastatin (CRESTOR) 20 MG tablet Take 1 tablet (20 mg total) by mouth at bedtime.    triamcinolone cream (KENALOG) 0.1 % Apply 1 application topically 2 (two) times daily.    No facility-administered encounter medications on file as of 08/13/2020.    Patient Active Problem List   Diagnosis Date Noted   Hyperlipidemia associated with type 2 diabetes mellitus ([place]) 12/23/2019   Nonrheumatic aortic (valve) stenosis 02/22/2018   Vitamin D deficiency 04/04/2016   Diabetes mellitus ([place]) 04/04/2016   Psoriasis 04/04/2016    Conditions to be addressed/monitored: monitor client  management of anxiety issues   Care Plan : LCSW care plan  Updates made by Katha Cabal, LCSW since 08/13/2020 12:00 AM     Problem: Emotional Distress      Goal: Emotional Health Supported; Manage anxiety issues   Start Date: 08/13/2020  Expected End Date: 11/09/2020  This Visit's Progress: On track  Priority: Medium  Note:   Current Barriers:   Chronic Mental Health needs related to anxiety management and depression management Dizziness occasionally Suicidal Ideation/Homicidal Ideation: No  Clinical Social Work Goal(s):  patient will work with SW monthly by telephone or in person to reduce or manage symptoms related to anxiety management and depression management Patient will communicate in next 30 days as needed with LCSW or RNCM for CCM support  Interventions: Patient interviewed and appropriate assessments performed: PHQ-9 and GAD-7 1:1 collaboration with Janora Norlander, DO regarding development and update of comprehensive plan of care as evidenced by provider attestation and co-signature Talked with client about her current needs Talked with client about episodes of dizziness of client Talked with client about family support from her daughters and from her son Talked with client about medication procurement of client Talked with client about ambulation of client (is not currently using a walker or a cane) Talked with client about sleeping issues of client Talked with client about her appointment for MRI in August of 2022. 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 Talked with client about pain issues of client Talked with client about limp on left side (she said when she rests at night she cannot lay on her left hip area) Provided counseling support for client Talked with client about upcoming client appointments Talked with client about anxiety issues management of client (client said she tends to worry a good bit; GAD-7 completed; PHQ-9 completed)  Patient Self Care Activities:  Self administers medications as prescribed Attends all scheduled provider appointments Performs ADL's independently  Patient Coping Strengths:  Family Friends  Patient Self Care Deficits:  Mobility challenges Occasional dizziness  Patient Goals:   -  practice relaxation or meditation daily - keep a calendar with appointment dates -talk with son, daughters regularly about her needs  Follow Up Plan: LCSW to call client on 09/22/20    [name].Claus Silvestro MSW, LCSW Licensed Clinical Social Worker [facility] Care Management [phone]

## 2020-08-13 NOTE — Patient Instructions (Signed)
Visit Information  PATIENT GOALS:  Goals Addressed             This Visit's Progress    Manage My Emotions; Manage anxiety issues faced       Timeframe:  Short-Term Goal Priority:  Medium Progress: On Track Start Date:      08/13/20                       Expected End Date:                      11/09/20  Follow Up Date 09/22/20   Manage Emotions: Manage anxiety issues faced    Why is this important?   When you are stressed, down or upset, your body reacts too.  For example, your blood pressure may get higher; you may have a headache or stomachache.  When your emotions get the best of you, your body's ability to fight off cold and flu gets weak.  These steps will help you manage your emotions.     Patient Self Care Activities:  Self administers medications as prescribed Attends all scheduled provider appointments Performs ADL's independently  Patient Coping Strengths:  Family Friends  Patient Self Care Deficits:  Mobility challenges Occasional dizziness  Patient Goals:   - practice relaxation or meditation daily - keep a calendar with appointment dates -talk with son, daughters regularly about her needs  Follow Up Plan: LCSW to call client on 09/22/20     [name].Shondale Quinley MSW, LCSW Licensed Clinical Social Worker [facility] Care Management [phone]

## 2020-08-16 DIAGNOSIS — R590 Localized enlarged lymph nodes: Secondary | ICD-10-CM | POA: Diagnosis not present

## 2020-08-16 DIAGNOSIS — R531 Weakness: Secondary | ICD-10-CM | POA: Diagnosis not present

## 2020-08-16 DIAGNOSIS — Z87891 Personal history of nicotine dependence: Secondary | ICD-10-CM | POA: Diagnosis not present

## 2020-08-16 DIAGNOSIS — G936 Cerebral edema: Secondary | ICD-10-CM | POA: Diagnosis not present

## 2020-08-16 DIAGNOSIS — R42 Dizziness and giddiness: Secondary | ICD-10-CM | POA: Diagnosis not present

## 2020-08-16 DIAGNOSIS — C3411 Malignant neoplasm of upper lobe, right bronchus or lung: Secondary | ICD-10-CM | POA: Diagnosis not present

## 2020-08-16 DIAGNOSIS — I700 Atherosclerosis of aorta: Secondary | ICD-10-CM | POA: Diagnosis not present

## 2020-08-16 DIAGNOSIS — R4182 Altered mental status, unspecified: Secondary | ICD-10-CM | POA: Diagnosis not present

## 2020-08-16 DIAGNOSIS — C7931 Secondary malignant neoplasm of brain: Secondary | ICD-10-CM | POA: Diagnosis not present

## 2020-08-16 DIAGNOSIS — R918 Other nonspecific abnormal finding of lung field: Secondary | ICD-10-CM | POA: Diagnosis not present

## 2020-08-16 DIAGNOSIS — E119 Type 2 diabetes mellitus without complications: Secondary | ICD-10-CM | POA: Diagnosis not present

## 2020-08-16 DIAGNOSIS — I639 Cerebral infarction, unspecified: Secondary | ICD-10-CM | POA: Diagnosis not present

## 2020-08-16 DIAGNOSIS — I452 Bifascicular block: Secondary | ICD-10-CM | POA: Diagnosis not present

## 2020-08-16 DIAGNOSIS — I10 Essential (primary) hypertension: Secondary | ICD-10-CM | POA: Diagnosis not present

## 2020-08-17 DIAGNOSIS — C3411 Malignant neoplasm of upper lobe, right bronchus or lung: Secondary | ICD-10-CM | POA: Diagnosis present

## 2020-08-17 DIAGNOSIS — B9789 Other viral agents as the cause of diseases classified elsewhere: Secondary | ICD-10-CM | POA: Diagnosis not present

## 2020-08-17 DIAGNOSIS — Z66 Do not resuscitate: Secondary | ICD-10-CM | POA: Diagnosis present

## 2020-08-17 DIAGNOSIS — R011 Cardiac murmur, unspecified: Secondary | ICD-10-CM | POA: Diagnosis not present

## 2020-08-17 DIAGNOSIS — Z87891 Personal history of nicotine dependence: Secondary | ICD-10-CM | POA: Diagnosis not present

## 2020-08-17 DIAGNOSIS — G9389 Other specified disorders of brain: Secondary | ICD-10-CM | POA: Diagnosis not present

## 2020-08-17 DIAGNOSIS — G939 Disorder of brain, unspecified: Secondary | ICD-10-CM | POA: Diagnosis not present

## 2020-08-17 DIAGNOSIS — J449 Chronic obstructive pulmonary disease, unspecified: Secondary | ICD-10-CM | POA: Diagnosis not present

## 2020-08-17 DIAGNOSIS — B348 Other viral infections of unspecified site: Secondary | ICD-10-CM | POA: Diagnosis present

## 2020-08-17 DIAGNOSIS — R531 Weakness: Secondary | ICD-10-CM | POA: Diagnosis not present

## 2020-08-17 DIAGNOSIS — Z20822 Contact with and (suspected) exposure to covid-19: Secondary | ICD-10-CM | POA: Diagnosis present

## 2020-08-17 DIAGNOSIS — E7800 Pure hypercholesterolemia, unspecified: Secondary | ICD-10-CM | POA: Diagnosis present

## 2020-08-17 DIAGNOSIS — R296 Repeated falls: Secondary | ICD-10-CM | POA: Diagnosis not present

## 2020-08-17 DIAGNOSIS — Z9181 History of falling: Secondary | ICD-10-CM | POA: Diagnosis not present

## 2020-08-17 DIAGNOSIS — C7931 Secondary malignant neoplasm of brain: Secondary | ICD-10-CM | POA: Diagnosis present

## 2020-08-17 DIAGNOSIS — R918 Other nonspecific abnormal finding of lung field: Secondary | ICD-10-CM | POA: Diagnosis not present

## 2020-08-17 DIAGNOSIS — I080 Rheumatic disorders of both mitral and aortic valves: Secondary | ICD-10-CM | POA: Diagnosis not present

## 2020-08-17 DIAGNOSIS — M6281 Muscle weakness (generalized): Secondary | ICD-10-CM | POA: Diagnosis not present

## 2020-08-17 DIAGNOSIS — I10 Essential (primary) hypertension: Secondary | ICD-10-CM | POA: Diagnosis present

## 2020-08-17 DIAGNOSIS — F419 Anxiety disorder, unspecified: Secondary | ICD-10-CM | POA: Diagnosis present

## 2020-08-17 DIAGNOSIS — I452 Bifascicular block: Secondary | ICD-10-CM | POA: Diagnosis present

## 2020-08-17 DIAGNOSIS — H8110 Benign paroxysmal vertigo, unspecified ear: Secondary | ICD-10-CM | POA: Diagnosis not present

## 2020-08-17 DIAGNOSIS — R2689 Other abnormalities of gait and mobility: Secondary | ICD-10-CM | POA: Diagnosis not present

## 2020-08-17 DIAGNOSIS — G936 Cerebral edema: Secondary | ICD-10-CM | POA: Diagnosis present

## 2020-08-17 DIAGNOSIS — F411 Generalized anxiety disorder: Secondary | ICD-10-CM | POA: Diagnosis not present

## 2020-08-17 DIAGNOSIS — D0220 Carcinoma in situ of unspecified bronchus and lung: Secondary | ICD-10-CM | POA: Diagnosis not present

## 2020-08-17 DIAGNOSIS — E119 Type 2 diabetes mellitus without complications: Secondary | ICD-10-CM | POA: Diagnosis present

## 2020-08-17 DIAGNOSIS — I6521 Occlusion and stenosis of right carotid artery: Secondary | ICD-10-CM | POA: Diagnosis not present

## 2020-08-17 DIAGNOSIS — I639 Cerebral infarction, unspecified: Secondary | ICD-10-CM | POA: Diagnosis not present

## 2020-08-17 DIAGNOSIS — E7849 Other hyperlipidemia: Secondary | ICD-10-CM | POA: Diagnosis not present

## 2020-08-17 DIAGNOSIS — C3491 Malignant neoplasm of unspecified part of right bronchus or lung: Secondary | ICD-10-CM | POA: Diagnosis not present

## 2020-08-17 DIAGNOSIS — G919 Hydrocephalus, unspecified: Secondary | ICD-10-CM | POA: Diagnosis not present

## 2020-08-19 DIAGNOSIS — G919 Hydrocephalus, unspecified: Secondary | ICD-10-CM | POA: Diagnosis not present

## 2020-08-19 DIAGNOSIS — G9389 Other specified disorders of brain: Secondary | ICD-10-CM | POA: Diagnosis not present

## 2020-08-19 DIAGNOSIS — E119 Type 2 diabetes mellitus without complications: Secondary | ICD-10-CM | POA: Diagnosis not present

## 2020-08-19 DIAGNOSIS — C7931 Secondary malignant neoplasm of brain: Secondary | ICD-10-CM | POA: Diagnosis not present

## 2020-08-19 DIAGNOSIS — Z9049 Acquired absence of other specified parts of digestive tract: Secondary | ICD-10-CM | POA: Diagnosis not present

## 2020-08-19 DIAGNOSIS — G936 Cerebral edema: Secondary | ICD-10-CM | POA: Diagnosis not present

## 2020-08-19 DIAGNOSIS — G911 Obstructive hydrocephalus: Secondary | ICD-10-CM | POA: Diagnosis not present

## 2020-08-19 DIAGNOSIS — Z803 Family history of malignant neoplasm of breast: Secondary | ICD-10-CM | POA: Diagnosis not present

## 2020-08-19 DIAGNOSIS — E1169 Type 2 diabetes mellitus with other specified complication: Secondary | ICD-10-CM | POA: Diagnosis not present

## 2020-08-19 DIAGNOSIS — K2900 Acute gastritis without bleeding: Secondary | ICD-10-CM | POA: Diagnosis not present

## 2020-08-19 DIAGNOSIS — D0220 Carcinoma in situ of unspecified bronchus and lung: Secondary | ICD-10-CM | POA: Diagnosis not present

## 2020-08-19 DIAGNOSIS — J449 Chronic obstructive pulmonary disease, unspecified: Secondary | ICD-10-CM | POA: Diagnosis not present

## 2020-08-19 DIAGNOSIS — D381 Neoplasm of uncertain behavior of trachea, bronchus and lung: Secondary | ICD-10-CM | POA: Diagnosis not present

## 2020-08-19 DIAGNOSIS — D496 Neoplasm of unspecified behavior of brain: Secondary | ICD-10-CM | POA: Diagnosis not present

## 2020-08-19 DIAGNOSIS — H8110 Benign paroxysmal vertigo, unspecified ear: Secondary | ICD-10-CM | POA: Diagnosis not present

## 2020-08-19 DIAGNOSIS — Z79899 Other long term (current) drug therapy: Secondary | ICD-10-CM | POA: Diagnosis not present

## 2020-08-19 DIAGNOSIS — C3491 Malignant neoplasm of unspecified part of right bronchus or lung: Secondary | ICD-10-CM | POA: Diagnosis not present

## 2020-08-19 DIAGNOSIS — F419 Anxiety disorder, unspecified: Secondary | ICD-10-CM | POA: Diagnosis not present

## 2020-08-19 DIAGNOSIS — C3411 Malignant neoplasm of upper lobe, right bronchus or lung: Secondary | ICD-10-CM | POA: Diagnosis not present

## 2020-08-19 DIAGNOSIS — I639 Cerebral infarction, unspecified: Secondary | ICD-10-CM | POA: Diagnosis not present

## 2020-08-19 DIAGNOSIS — B348 Other viral infections of unspecified site: Secondary | ICD-10-CM | POA: Diagnosis not present

## 2020-08-19 DIAGNOSIS — R945 Abnormal results of liver function studies: Secondary | ICD-10-CM | POA: Diagnosis not present

## 2020-08-19 DIAGNOSIS — R2689 Other abnormalities of gait and mobility: Secondary | ICD-10-CM | POA: Diagnosis not present

## 2020-08-19 DIAGNOSIS — C3490 Malignant neoplasm of unspecified part of unspecified bronchus or lung: Secondary | ICD-10-CM | POA: Diagnosis not present

## 2020-08-19 DIAGNOSIS — F411 Generalized anxiety disorder: Secondary | ICD-10-CM | POA: Diagnosis not present

## 2020-08-19 DIAGNOSIS — R296 Repeated falls: Secondary | ICD-10-CM | POA: Diagnosis not present

## 2020-08-19 DIAGNOSIS — I350 Nonrheumatic aortic (valve) stenosis: Secondary | ICD-10-CM | POA: Diagnosis not present

## 2020-08-19 DIAGNOSIS — R630 Anorexia: Secondary | ICD-10-CM | POA: Diagnosis not present

## 2020-08-19 DIAGNOSIS — E7849 Other hyperlipidemia: Secondary | ICD-10-CM | POA: Diagnosis not present

## 2020-08-19 DIAGNOSIS — B9789 Other viral agents as the cause of diseases classified elsewhere: Secondary | ICD-10-CM | POA: Diagnosis not present

## 2020-08-19 DIAGNOSIS — Z87891 Personal history of nicotine dependence: Secondary | ICD-10-CM | POA: Diagnosis not present

## 2020-08-19 DIAGNOSIS — Z90711 Acquired absence of uterus with remaining cervical stump: Secondary | ICD-10-CM | POA: Diagnosis not present

## 2020-08-19 DIAGNOSIS — R531 Weakness: Secondary | ICD-10-CM | POA: Diagnosis not present

## 2020-08-19 DIAGNOSIS — M6281 Muscle weakness (generalized): Secondary | ICD-10-CM | POA: Diagnosis not present

## 2020-08-23 DIAGNOSIS — D381 Neoplasm of uncertain behavior of trachea, bronchus and lung: Secondary | ICD-10-CM | POA: Diagnosis not present

## 2020-08-23 DIAGNOSIS — R945 Abnormal results of liver function studies: Secondary | ICD-10-CM | POA: Diagnosis not present

## 2020-08-23 DIAGNOSIS — E1169 Type 2 diabetes mellitus with other specified complication: Secondary | ICD-10-CM | POA: Diagnosis not present

## 2020-08-23 DIAGNOSIS — D496 Neoplasm of unspecified behavior of brain: Secondary | ICD-10-CM | POA: Diagnosis not present

## 2020-08-23 DIAGNOSIS — I350 Nonrheumatic aortic (valve) stenosis: Secondary | ICD-10-CM | POA: Diagnosis not present

## 2020-08-25 ENCOUNTER — Ambulatory Visit (HOSPITAL_COMMUNITY): Admission: RE | Admit: 2020-08-25 | Payer: Medicare Other | Source: Ambulatory Visit

## 2020-08-25 ENCOUNTER — Ambulatory Visit (HOSPITAL_COMMUNITY): Payer: Medicare Other | Attending: Family Medicine

## 2020-08-25 ENCOUNTER — Ambulatory Visit: Payer: Medicare Other | Admitting: Family Medicine

## 2020-09-01 DIAGNOSIS — D496 Neoplasm of unspecified behavior of brain: Secondary | ICD-10-CM | POA: Diagnosis not present

## 2020-09-01 DIAGNOSIS — E1169 Type 2 diabetes mellitus with other specified complication: Secondary | ICD-10-CM | POA: Diagnosis not present

## 2020-09-01 DIAGNOSIS — G9389 Other specified disorders of brain: Secondary | ICD-10-CM | POA: Diagnosis not present

## 2020-09-01 DIAGNOSIS — G911 Obstructive hydrocephalus: Secondary | ICD-10-CM | POA: Diagnosis not present

## 2020-09-01 DIAGNOSIS — G919 Hydrocephalus, unspecified: Secondary | ICD-10-CM | POA: Diagnosis not present

## 2020-09-01 DIAGNOSIS — C3490 Malignant neoplasm of unspecified part of unspecified bronchus or lung: Secondary | ICD-10-CM | POA: Diagnosis not present

## 2020-09-01 DIAGNOSIS — R945 Abnormal results of liver function studies: Secondary | ICD-10-CM | POA: Diagnosis not present

## 2020-09-03 DIAGNOSIS — D496 Neoplasm of unspecified behavior of brain: Secondary | ICD-10-CM | POA: Diagnosis not present

## 2020-09-03 DIAGNOSIS — D381 Neoplasm of uncertain behavior of trachea, bronchus and lung: Secondary | ICD-10-CM | POA: Diagnosis not present

## 2020-09-03 DIAGNOSIS — C7931 Secondary malignant neoplasm of brain: Secondary | ICD-10-CM | POA: Diagnosis not present

## 2020-09-03 DIAGNOSIS — G936 Cerebral edema: Secondary | ICD-10-CM | POA: Diagnosis not present

## 2020-09-03 DIAGNOSIS — K2900 Acute gastritis without bleeding: Secondary | ICD-10-CM | POA: Diagnosis not present

## 2020-09-03 DIAGNOSIS — E1169 Type 2 diabetes mellitus with other specified complication: Secondary | ICD-10-CM | POA: Diagnosis not present

## 2020-09-03 DIAGNOSIS — E119 Type 2 diabetes mellitus without complications: Secondary | ICD-10-CM | POA: Diagnosis not present

## 2020-09-06 ENCOUNTER — Telehealth: Payer: Self-pay | Admitting: Family Medicine

## 2020-09-06 DIAGNOSIS — E1169 Type 2 diabetes mellitus with other specified complication: Secondary | ICD-10-CM | POA: Diagnosis not present

## 2020-09-06 DIAGNOSIS — G936 Cerebral edema: Secondary | ICD-10-CM | POA: Diagnosis not present

## 2020-09-06 DIAGNOSIS — D381 Neoplasm of uncertain behavior of trachea, bronchus and lung: Secondary | ICD-10-CM | POA: Diagnosis not present

## 2020-09-06 DIAGNOSIS — D496 Neoplasm of unspecified behavior of brain: Secondary | ICD-10-CM | POA: Diagnosis not present

## 2020-09-06 DIAGNOSIS — E119 Type 2 diabetes mellitus without complications: Secondary | ICD-10-CM | POA: Diagnosis not present

## 2020-09-06 DIAGNOSIS — C3411 Malignant neoplasm of upper lobe, right bronchus or lung: Secondary | ICD-10-CM | POA: Diagnosis not present

## 2020-09-06 DIAGNOSIS — C7931 Secondary malignant neoplasm of brain: Secondary | ICD-10-CM | POA: Diagnosis not present

## 2020-09-07 ENCOUNTER — Ambulatory Visit: Payer: Medicare Other | Admitting: Family Medicine

## 2020-09-07 DIAGNOSIS — D496 Neoplasm of unspecified behavior of brain: Secondary | ICD-10-CM | POA: Diagnosis not present

## 2020-09-07 DIAGNOSIS — D381 Neoplasm of uncertain behavior of trachea, bronchus and lung: Secondary | ICD-10-CM | POA: Diagnosis not present

## 2020-09-07 DIAGNOSIS — E1169 Type 2 diabetes mellitus with other specified complication: Secondary | ICD-10-CM | POA: Diagnosis not present

## 2020-09-07 DIAGNOSIS — C7931 Secondary malignant neoplasm of brain: Secondary | ICD-10-CM | POA: Diagnosis not present

## 2020-09-07 DIAGNOSIS — G936 Cerebral edema: Secondary | ICD-10-CM | POA: Diagnosis not present

## 2020-09-07 DIAGNOSIS — C3411 Malignant neoplasm of upper lobe, right bronchus or lung: Secondary | ICD-10-CM | POA: Diagnosis not present

## 2020-09-07 DIAGNOSIS — E119 Type 2 diabetes mellitus without complications: Secondary | ICD-10-CM | POA: Diagnosis not present

## 2020-09-07 NOTE — Telephone Encounter (Signed)
I spoke with Emily Sanders today.  She lets me know that patient has chosen to go into hospice care.  She did not wish to undergo any radiation treatments and therefore they will forego biopsy of the lesions that have been identified.  She will be utilizing the hospice care doctors.  I have asked that she please keep me in the loop if there are any concerns or questions going forward or if there is anything that we can be of assistance. Emily Sanders and Emily Sanders will be in our thoughts and prayers during this difficult time

## 2020-09-08 ENCOUNTER — Other Ambulatory Visit: Payer: Self-pay | Admitting: Family Medicine

## 2020-09-08 DIAGNOSIS — R42 Dizziness and giddiness: Secondary | ICD-10-CM

## 2020-09-08 NOTE — Telephone Encounter (Signed)
Last office visit 08/11/20 Last refill 08/06/20, #60 no refills

## 2020-09-09 DIAGNOSIS — C7931 Secondary malignant neoplasm of brain: Secondary | ICD-10-CM | POA: Diagnosis not present

## 2020-09-09 DIAGNOSIS — J449 Chronic obstructive pulmonary disease, unspecified: Secondary | ICD-10-CM | POA: Diagnosis not present

## 2020-09-09 DIAGNOSIS — I639 Cerebral infarction, unspecified: Secondary | ICD-10-CM | POA: Diagnosis not present

## 2020-09-09 DIAGNOSIS — R531 Weakness: Secondary | ICD-10-CM | POA: Diagnosis not present

## 2020-09-09 DIAGNOSIS — C3411 Malignant neoplasm of upper lobe, right bronchus or lung: Secondary | ICD-10-CM | POA: Diagnosis not present

## 2020-09-09 DIAGNOSIS — R42 Dizziness and giddiness: Secondary | ICD-10-CM | POA: Diagnosis not present

## 2020-09-09 DIAGNOSIS — E119 Type 2 diabetes mellitus without complications: Secondary | ICD-10-CM | POA: Diagnosis not present

## 2020-09-09 DIAGNOSIS — R112 Nausea with vomiting, unspecified: Secondary | ICD-10-CM | POA: Diagnosis not present

## 2020-09-10 DIAGNOSIS — E119 Type 2 diabetes mellitus without complications: Secondary | ICD-10-CM | POA: Diagnosis not present

## 2020-09-10 DIAGNOSIS — I639 Cerebral infarction, unspecified: Secondary | ICD-10-CM | POA: Diagnosis not present

## 2020-09-10 DIAGNOSIS — J449 Chronic obstructive pulmonary disease, unspecified: Secondary | ICD-10-CM | POA: Diagnosis not present

## 2020-09-10 DIAGNOSIS — R112 Nausea with vomiting, unspecified: Secondary | ICD-10-CM | POA: Diagnosis not present

## 2020-09-10 DIAGNOSIS — C7931 Secondary malignant neoplasm of brain: Secondary | ICD-10-CM | POA: Diagnosis not present

## 2020-09-10 DIAGNOSIS — C3411 Malignant neoplasm of upper lobe, right bronchus or lung: Secondary | ICD-10-CM | POA: Diagnosis not present

## 2020-09-13 DIAGNOSIS — C7931 Secondary malignant neoplasm of brain: Secondary | ICD-10-CM | POA: Diagnosis not present

## 2020-09-13 DIAGNOSIS — C3411 Malignant neoplasm of upper lobe, right bronchus or lung: Secondary | ICD-10-CM | POA: Diagnosis not present

## 2020-09-13 DIAGNOSIS — E119 Type 2 diabetes mellitus without complications: Secondary | ICD-10-CM | POA: Diagnosis not present

## 2020-09-13 DIAGNOSIS — J449 Chronic obstructive pulmonary disease, unspecified: Secondary | ICD-10-CM | POA: Diagnosis not present

## 2020-09-13 DIAGNOSIS — I639 Cerebral infarction, unspecified: Secondary | ICD-10-CM | POA: Diagnosis not present

## 2020-09-13 DIAGNOSIS — R112 Nausea with vomiting, unspecified: Secondary | ICD-10-CM | POA: Diagnosis not present

## 2020-09-15 DIAGNOSIS — I639 Cerebral infarction, unspecified: Secondary | ICD-10-CM | POA: Diagnosis not present

## 2020-09-15 DIAGNOSIS — C7931 Secondary malignant neoplasm of brain: Secondary | ICD-10-CM | POA: Diagnosis not present

## 2020-09-15 DIAGNOSIS — C3411 Malignant neoplasm of upper lobe, right bronchus or lung: Secondary | ICD-10-CM | POA: Diagnosis not present

## 2020-09-15 DIAGNOSIS — E119 Type 2 diabetes mellitus without complications: Secondary | ICD-10-CM | POA: Diagnosis not present

## 2020-09-15 DIAGNOSIS — R112 Nausea with vomiting, unspecified: Secondary | ICD-10-CM | POA: Diagnosis not present

## 2020-09-15 DIAGNOSIS — J449 Chronic obstructive pulmonary disease, unspecified: Secondary | ICD-10-CM | POA: Diagnosis not present

## 2020-09-16 DIAGNOSIS — E119 Type 2 diabetes mellitus without complications: Secondary | ICD-10-CM | POA: Diagnosis not present

## 2020-09-16 DIAGNOSIS — J449 Chronic obstructive pulmonary disease, unspecified: Secondary | ICD-10-CM | POA: Diagnosis not present

## 2020-09-16 DIAGNOSIS — L409 Psoriasis, unspecified: Secondary | ICD-10-CM | POA: Diagnosis not present

## 2020-09-16 DIAGNOSIS — Z7984 Long term (current) use of oral hypoglycemic drugs: Secondary | ICD-10-CM | POA: Diagnosis not present

## 2020-09-16 DIAGNOSIS — R1310 Dysphagia, unspecified: Secondary | ICD-10-CM | POA: Diagnosis not present

## 2020-09-16 DIAGNOSIS — R112 Nausea with vomiting, unspecified: Secondary | ICD-10-CM | POA: Diagnosis not present

## 2020-09-16 DIAGNOSIS — B370 Candidal stomatitis: Secondary | ICD-10-CM | POA: Diagnosis not present

## 2020-09-16 DIAGNOSIS — Z87891 Personal history of nicotine dependence: Secondary | ICD-10-CM | POA: Diagnosis not present

## 2020-09-16 DIAGNOSIS — Z7982 Long term (current) use of aspirin: Secondary | ICD-10-CM | POA: Diagnosis not present

## 2020-09-16 DIAGNOSIS — E860 Dehydration: Secondary | ICD-10-CM | POA: Diagnosis not present

## 2020-09-16 DIAGNOSIS — R638 Other symptoms and signs concerning food and fluid intake: Secondary | ICD-10-CM | POA: Diagnosis not present

## 2020-09-16 DIAGNOSIS — C3411 Malignant neoplasm of upper lobe, right bronchus or lung: Secondary | ICD-10-CM | POA: Diagnosis not present

## 2020-09-16 DIAGNOSIS — Z79899 Other long term (current) drug therapy: Secondary | ICD-10-CM | POA: Diagnosis not present

## 2020-09-16 DIAGNOSIS — Z66 Do not resuscitate: Secondary | ICD-10-CM | POA: Diagnosis not present

## 2020-09-16 DIAGNOSIS — I350 Nonrheumatic aortic (valve) stenosis: Secondary | ICD-10-CM | POA: Diagnosis not present

## 2020-09-16 DIAGNOSIS — C7931 Secondary malignant neoplasm of brain: Secondary | ICD-10-CM | POA: Diagnosis not present

## 2020-09-16 DIAGNOSIS — I639 Cerebral infarction, unspecified: Secondary | ICD-10-CM | POA: Diagnosis not present

## 2020-09-16 DIAGNOSIS — R0902 Hypoxemia: Secondary | ICD-10-CM | POA: Diagnosis not present

## 2020-09-17 DIAGNOSIS — E119 Type 2 diabetes mellitus without complications: Secondary | ICD-10-CM | POA: Diagnosis not present

## 2020-09-17 DIAGNOSIS — J449 Chronic obstructive pulmonary disease, unspecified: Secondary | ICD-10-CM | POA: Diagnosis not present

## 2020-09-17 DIAGNOSIS — I639 Cerebral infarction, unspecified: Secondary | ICD-10-CM | POA: Diagnosis not present

## 2020-09-17 DIAGNOSIS — C3411 Malignant neoplasm of upper lobe, right bronchus or lung: Secondary | ICD-10-CM | POA: Diagnosis not present

## 2020-09-17 DIAGNOSIS — R112 Nausea with vomiting, unspecified: Secondary | ICD-10-CM | POA: Diagnosis not present

## 2020-09-17 DIAGNOSIS — C7931 Secondary malignant neoplasm of brain: Secondary | ICD-10-CM | POA: Diagnosis not present

## 2020-09-20 DIAGNOSIS — R112 Nausea with vomiting, unspecified: Secondary | ICD-10-CM | POA: Diagnosis not present

## 2020-09-20 DIAGNOSIS — E119 Type 2 diabetes mellitus without complications: Secondary | ICD-10-CM | POA: Diagnosis not present

## 2020-09-20 DIAGNOSIS — J449 Chronic obstructive pulmonary disease, unspecified: Secondary | ICD-10-CM | POA: Diagnosis not present

## 2020-09-20 DIAGNOSIS — I639 Cerebral infarction, unspecified: Secondary | ICD-10-CM | POA: Diagnosis not present

## 2020-09-20 DIAGNOSIS — C3411 Malignant neoplasm of upper lobe, right bronchus or lung: Secondary | ICD-10-CM | POA: Diagnosis not present

## 2020-09-20 DIAGNOSIS — C7931 Secondary malignant neoplasm of brain: Secondary | ICD-10-CM | POA: Diagnosis not present

## 2020-09-22 ENCOUNTER — Ambulatory Visit: Payer: Medicare Other | Admitting: Licensed Clinical Social Worker

## 2020-09-22 DIAGNOSIS — C7931 Secondary malignant neoplasm of brain: Secondary | ICD-10-CM | POA: Diagnosis not present

## 2020-09-22 DIAGNOSIS — I639 Cerebral infarction, unspecified: Secondary | ICD-10-CM | POA: Diagnosis not present

## 2020-09-22 DIAGNOSIS — J449 Chronic obstructive pulmonary disease, unspecified: Secondary | ICD-10-CM | POA: Diagnosis not present

## 2020-09-22 DIAGNOSIS — E559 Vitamin D deficiency, unspecified: Secondary | ICD-10-CM

## 2020-09-22 DIAGNOSIS — E1169 Type 2 diabetes mellitus with other specified complication: Secondary | ICD-10-CM

## 2020-09-22 DIAGNOSIS — C3411 Malignant neoplasm of upper lobe, right bronchus or lung: Secondary | ICD-10-CM | POA: Diagnosis not present

## 2020-09-22 DIAGNOSIS — L409 Psoriasis, unspecified: Secondary | ICD-10-CM

## 2020-09-22 DIAGNOSIS — R42 Dizziness and giddiness: Secondary | ICD-10-CM

## 2020-09-22 DIAGNOSIS — R112 Nausea with vomiting, unspecified: Secondary | ICD-10-CM | POA: Diagnosis not present

## 2020-09-22 DIAGNOSIS — E785 Hyperlipidemia, unspecified: Secondary | ICD-10-CM

## 2020-09-22 DIAGNOSIS — E119 Type 2 diabetes mellitus without complications: Secondary | ICD-10-CM | POA: Diagnosis not present

## 2020-09-22 NOTE — Patient Instructions (Signed)
Visit Information  PATIENT GOALS:  Goals Addressed             This Visit's Progress    Manage My Emotions; Manage anxiety issues faced       Timeframe:  Short-Term Goal Priority:  Medium Progress: On Track Start Date:      09/22/20                       Expected End Date:    12/20/20                    Follow Up Date LCSW is discharging client today from CCM services since client is under full Hospice care in the home   Manage Emotions: Manage anxiety issues faced    Why is this important?   When you are stressed, down or upset, your body reacts too.  For example, your blood pressure may get higher; you may have a headache or stomachache.  When your emotions get the best of you, your body's ability to fight off cold and flu gets weak.  These steps will help you manage your emotions.     Patient Self Care Activities:  Self administers medications as prescribed Attends all scheduled provider appointments Performs ADL's independently  Patient Coping Strengths:  Family Friends  Patient Self Care Deficits:  Mobility challenges Occasional dizziness  Patient Goals:   - practice relaxation or meditation daily - keep a calendar with appointment dates -talk with son, daughters regularly about her needs  Follow Up Plan: Client is under Hospice Care in the Home. Discharged today from [place].Gal Smolinski MSW, LCSW Licensed Clinical Social Worker [facility] Care Management [phone]

## 2020-09-22 NOTE — Chronic Care Management (AMB) (Signed)
Chronic Care Management    Clinical Social Work Note  09/22/2020 Name: Emily Sanders MRN: [id number] DOB: 1930/10/07  Emily Sanders is a 87 y.o. year old female who is a primary care patient of Janora Norlander, DO. The CCM team was consulted to assist the patient with chronic disease management and/or care coordination needs related to: [employer] .   Engaged with patient / daughter of patient, [name], by telephone for follow up visit in response to provider referral for social work chronic care management and care coordination services.   Consent to Services:  The patient was given information about Chronic Care Management services, agreed to services, and gave verbal consent prior to initiation of services.  Please see initial visit note for detailed documentation.   Patient agreed to services and consent obtained.   Assessment: Review of patient past medical history, allergies, medications, and health status, including review of relevant consultants reports was performed today as part of a comprehensive evaluation and provision of chronic care management and care coordination services.     SDOH (Social Determinants of Health) assessments and interventions performed:  SDOH Interventions    Flowsheet Row Most Recent Value  SDOH Interventions   Physical Activity Interventions Other (Comments)  [patient is under Hospice Care in the home]  Depression Interventions/Treatment  Counseling        Advanced Directives Status: See Vynca application for related entries.  CCM Care Plan  No Known Allergies  Outpatient Encounter Medications as of 09/22/2020  Medication Sig Note   aspirin EC 81 MG tablet Take 81 mg by mouth daily. Swallow whole. 08/06/2020: Maybe 2-3 times a week    Blood Glucose Monitoring Suppl (BLOOD GLUCOSE MONITOR SYSTEM) w/Device KIT 1 Device by Does not apply route 2 (two) times daily.    glucose blood test strip Check blood glucose  twice daily    Lancets (FREESTYLE) lancets Test Bs twice daily Dx E11.9    LORazepam (ATIVAN) 0.5 MG tablet Take 1 tablet (0.5 mg total) by mouth 2 (two) times daily as needed for anxiety.    meclizine (ANTIVERT) 12.5 MG tablet TAKE 1 TABLET BY MOUTH 3 TIMES DAILY AS NEEDED FOR DIZZINESS.    metFORMIN (GLUCOPHAGE) 1000 MG tablet Take 1 tablet (1,000 mg total) by mouth 2 (two) times daily with a meal.    rosuvastatin (CRESTOR) 20 MG tablet Take 1 tablet (20 mg total) by mouth at bedtime.    triamcinolone cream (KENALOG) 0.1 % Apply 1 application topically 2 (two) times daily.    No facility-administered encounter medications on file as of 09/22/2020.    Patient Active Problem List   Diagnosis Date Noted   Hyperlipidemia associated with type 2 diabetes mellitus ([place]) 12/23/2019   Nonrheumatic aortic (valve) stenosis 02/22/2018   Vitamin D deficiency 04/04/2016   Diabetes mellitus ([place]) 04/04/2016   Psoriasis 04/04/2016    Conditions to be addressed/monitored: monitor client management of anxiety issues faced  Care Plan : LCSW care plan  Updates made by Katha Cabal, LCSW since 09/22/2020 12:00 AM     Problem: Emotional Distress      Goal: Emotional Health Supported; Manage anxiety issues   Start Date: 09/22/2020  Expected End Date: 12/20/2020  Recent Progress: On track  Priority: Medium  Note:   Current Barriers:  Chronic Mental Health needs related to anxiety management and depression management Dizziness occasionally Suicidal Ideation/Homicidal Ideation: No  Clinical Social Work Goal(s):  patient will work with SW monthly by  telephone or in person to reduce or manage symptoms related to anxiety management and depression management Patient will communicate in next 30 days as needed with LCSW or RNCM for CCM support  Interventions: 1:1 collaboration with Janora Norlander, DO regarding development and update of comprehensive plan of care as evidenced by provider  attestation and co-signature Talked with daughter of client, [name], about current needs of client Talked with [name] about fact that client was now under [facility] in the Home. [name] said family was very pleased with Hospice Care. RN from Hospice has been making home visits as scheduled. Client has a hospital bed to use as needed. [name] said Chaplain from Hospice made a home visit with client and family yesterday.  [name] said client has been under Hospice care for about 2 weeks. [name] had question about medication need of client. LCSW encouraged [name] to contact Hospice Nurse working with client  in the home to discuss with RN from Hospice medication needs of client.  [name] said she would call Hospice again and leave message for RN from Hospice to call her back to discuss her medication question LCSW thanked [name] for phone call today with LCSW and encouraged client and family to utilize full range of Hospice services for client and family during this time. [name] was appreciative of call from LCSW Talked with [name] about pain issues of client [name] said client is not having any pain issues at present) Talked with [name] about quality of life issues for patient.  Patient Self Care Activities:  Has family support Talks daily with family members about her needs  Patient Coping Strengths:  Family Friends  Patient Self Care Deficits:  Mobility challenges Occasional dizziness  Patient Goals:   - practice relaxation or meditation daily - keep a calendar with appointment dates -talk with son, daughters regularly about her needs  Follow Up Plan: LCSW is discharging patient today from CCM services since patient is now under full Hospice Care in the home environment.      [name].[name] MSW, LCSW Licensed Clinical Social Worker [facility] Care Management [phone]

## 2020-09-23 DIAGNOSIS — C7931 Secondary malignant neoplasm of brain: Secondary | ICD-10-CM | POA: Diagnosis not present

## 2020-09-23 DIAGNOSIS — C3411 Malignant neoplasm of upper lobe, right bronchus or lung: Secondary | ICD-10-CM | POA: Diagnosis not present

## 2020-09-23 DIAGNOSIS — I639 Cerebral infarction, unspecified: Secondary | ICD-10-CM | POA: Diagnosis not present

## 2020-09-23 DIAGNOSIS — R531 Weakness: Secondary | ICD-10-CM | POA: Diagnosis not present

## 2020-09-23 DIAGNOSIS — E119 Type 2 diabetes mellitus without complications: Secondary | ICD-10-CM | POA: Diagnosis not present

## 2020-09-23 DIAGNOSIS — R42 Dizziness and giddiness: Secondary | ICD-10-CM | POA: Diagnosis not present

## 2020-09-23 DIAGNOSIS — J449 Chronic obstructive pulmonary disease, unspecified: Secondary | ICD-10-CM | POA: Diagnosis not present

## 2020-09-23 DIAGNOSIS — R112 Nausea with vomiting, unspecified: Secondary | ICD-10-CM | POA: Diagnosis not present

## 2020-09-24 DIAGNOSIS — I639 Cerebral infarction, unspecified: Secondary | ICD-10-CM | POA: Diagnosis not present

## 2020-09-24 DIAGNOSIS — C7931 Secondary malignant neoplasm of brain: Secondary | ICD-10-CM | POA: Diagnosis not present

## 2020-09-24 DIAGNOSIS — E119 Type 2 diabetes mellitus without complications: Secondary | ICD-10-CM | POA: Diagnosis not present

## 2020-09-24 DIAGNOSIS — R112 Nausea with vomiting, unspecified: Secondary | ICD-10-CM | POA: Diagnosis not present

## 2020-09-24 DIAGNOSIS — C3411 Malignant neoplasm of upper lobe, right bronchus or lung: Secondary | ICD-10-CM | POA: Diagnosis not present

## 2020-09-24 DIAGNOSIS — J449 Chronic obstructive pulmonary disease, unspecified: Secondary | ICD-10-CM | POA: Diagnosis not present

## 2020-09-27 DIAGNOSIS — C3411 Malignant neoplasm of upper lobe, right bronchus or lung: Secondary | ICD-10-CM | POA: Diagnosis not present

## 2020-09-27 DIAGNOSIS — J449 Chronic obstructive pulmonary disease, unspecified: Secondary | ICD-10-CM | POA: Diagnosis not present

## 2020-09-27 DIAGNOSIS — I639 Cerebral infarction, unspecified: Secondary | ICD-10-CM | POA: Diagnosis not present

## 2020-09-27 DIAGNOSIS — C7931 Secondary malignant neoplasm of brain: Secondary | ICD-10-CM | POA: Diagnosis not present

## 2020-09-27 DIAGNOSIS — E119 Type 2 diabetes mellitus without complications: Secondary | ICD-10-CM | POA: Diagnosis not present

## 2020-09-27 DIAGNOSIS — R112 Nausea with vomiting, unspecified: Secondary | ICD-10-CM | POA: Diagnosis not present

## 2020-09-28 DIAGNOSIS — C3411 Malignant neoplasm of upper lobe, right bronchus or lung: Secondary | ICD-10-CM | POA: Diagnosis not present

## 2020-09-28 DIAGNOSIS — C7931 Secondary malignant neoplasm of brain: Secondary | ICD-10-CM | POA: Diagnosis not present

## 2020-09-28 DIAGNOSIS — J449 Chronic obstructive pulmonary disease, unspecified: Secondary | ICD-10-CM | POA: Diagnosis not present

## 2020-09-28 DIAGNOSIS — I639 Cerebral infarction, unspecified: Secondary | ICD-10-CM | POA: Diagnosis not present

## 2020-09-28 DIAGNOSIS — R112 Nausea with vomiting, unspecified: Secondary | ICD-10-CM | POA: Diagnosis not present

## 2020-09-28 DIAGNOSIS — E119 Type 2 diabetes mellitus without complications: Secondary | ICD-10-CM | POA: Diagnosis not present

## 2020-09-29 DIAGNOSIS — J449 Chronic obstructive pulmonary disease, unspecified: Secondary | ICD-10-CM | POA: Diagnosis not present

## 2020-09-29 DIAGNOSIS — C7931 Secondary malignant neoplasm of brain: Secondary | ICD-10-CM | POA: Diagnosis not present

## 2020-09-29 DIAGNOSIS — R112 Nausea with vomiting, unspecified: Secondary | ICD-10-CM | POA: Diagnosis not present

## 2020-09-29 DIAGNOSIS — C3411 Malignant neoplasm of upper lobe, right bronchus or lung: Secondary | ICD-10-CM | POA: Diagnosis not present

## 2020-09-29 DIAGNOSIS — E119 Type 2 diabetes mellitus without complications: Secondary | ICD-10-CM | POA: Diagnosis not present

## 2020-09-29 DIAGNOSIS — I639 Cerebral infarction, unspecified: Secondary | ICD-10-CM | POA: Diagnosis not present

## 2020-09-30 DIAGNOSIS — C7931 Secondary malignant neoplasm of brain: Secondary | ICD-10-CM | POA: Diagnosis not present

## 2020-09-30 DIAGNOSIS — C3411 Malignant neoplasm of upper lobe, right bronchus or lung: Secondary | ICD-10-CM | POA: Diagnosis not present

## 2020-09-30 DIAGNOSIS — I639 Cerebral infarction, unspecified: Secondary | ICD-10-CM | POA: Diagnosis not present

## 2020-09-30 DIAGNOSIS — R112 Nausea with vomiting, unspecified: Secondary | ICD-10-CM | POA: Diagnosis not present

## 2020-09-30 DIAGNOSIS — J449 Chronic obstructive pulmonary disease, unspecified: Secondary | ICD-10-CM | POA: Diagnosis not present

## 2020-09-30 DIAGNOSIS — E119 Type 2 diabetes mellitus without complications: Secondary | ICD-10-CM | POA: Diagnosis not present

## 2020-10-01 DIAGNOSIS — I639 Cerebral infarction, unspecified: Secondary | ICD-10-CM | POA: Diagnosis not present

## 2020-10-01 DIAGNOSIS — J449 Chronic obstructive pulmonary disease, unspecified: Secondary | ICD-10-CM | POA: Diagnosis not present

## 2020-10-01 DIAGNOSIS — C3411 Malignant neoplasm of upper lobe, right bronchus or lung: Secondary | ICD-10-CM | POA: Diagnosis not present

## 2020-10-01 DIAGNOSIS — R112 Nausea with vomiting, unspecified: Secondary | ICD-10-CM | POA: Diagnosis not present

## 2020-10-01 DIAGNOSIS — E119 Type 2 diabetes mellitus without complications: Secondary | ICD-10-CM | POA: Diagnosis not present

## 2020-10-01 DIAGNOSIS — C7931 Secondary malignant neoplasm of brain: Secondary | ICD-10-CM | POA: Diagnosis not present

## 2020-10-02 DIAGNOSIS — I639 Cerebral infarction, unspecified: Secondary | ICD-10-CM | POA: Diagnosis not present

## 2020-10-02 DIAGNOSIS — C7931 Secondary malignant neoplasm of brain: Secondary | ICD-10-CM | POA: Diagnosis not present

## 2020-10-02 DIAGNOSIS — C3411 Malignant neoplasm of upper lobe, right bronchus or lung: Secondary | ICD-10-CM | POA: Diagnosis not present

## 2020-10-02 DIAGNOSIS — J449 Chronic obstructive pulmonary disease, unspecified: Secondary | ICD-10-CM | POA: Diagnosis not present

## 2020-10-02 DIAGNOSIS — R112 Nausea with vomiting, unspecified: Secondary | ICD-10-CM | POA: Diagnosis not present

## 2020-10-02 DIAGNOSIS — E119 Type 2 diabetes mellitus without complications: Secondary | ICD-10-CM | POA: Diagnosis not present

## 2020-10-13 ENCOUNTER — Ambulatory Visit: Payer: Medicare Other | Admitting: Cardiology

## 2020-10-23 DEATH — deceased
